# Patient Record
Sex: Female | Born: 1950 | Race: White | Hispanic: No | Smoking: Current every day smoker
Health system: Southern US, Community
[De-identification: ages and names within clinical notes are randomized; demographics above are authoritative.]

## PROBLEM LIST (undated history)

## (undated) DIAGNOSIS — J439 Emphysema, unspecified: Secondary | ICD-10-CM

## (undated) DIAGNOSIS — Z8619 Personal history of other infectious and parasitic diseases: Secondary | ICD-10-CM

## (undated) DIAGNOSIS — G709 Myoneural disorder, unspecified: Secondary | ICD-10-CM

## (undated) DIAGNOSIS — R0602 Shortness of breath: Secondary | ICD-10-CM

## (undated) DIAGNOSIS — E785 Hyperlipidemia, unspecified: Secondary | ICD-10-CM

## (undated) DIAGNOSIS — E7800 Pure hypercholesterolemia, unspecified: Secondary | ICD-10-CM

## (undated) DIAGNOSIS — C801 Malignant (primary) neoplasm, unspecified: Secondary | ICD-10-CM

## (undated) DIAGNOSIS — T783XXA Angioneurotic edema, initial encounter: Secondary | ICD-10-CM

## (undated) DIAGNOSIS — J449 Chronic obstructive pulmonary disease, unspecified: Secondary | ICD-10-CM

## (undated) DIAGNOSIS — F419 Anxiety disorder, unspecified: Secondary | ICD-10-CM

## (undated) HISTORY — PX: CYST REMOVAL HAND: SHX6279

## (undated) HISTORY — DX: Hyperlipidemia, unspecified: E78.5

## (undated) HISTORY — PX: OTHER SURGICAL HISTORY: SHX169

## (undated) HISTORY — PX: BREAST SURGERY: SHX581

## (undated) HISTORY — DX: Pure hypercholesterolemia, unspecified: E78.00

## (undated) HISTORY — DX: Angioneurotic edema, initial encounter: T78.3XXA

---

## 1972-08-01 HISTORY — PX: TUBAL LIGATION: SHX77

## 1988-08-01 HISTORY — PX: ABDOMINAL HYSTERECTOMY: SHX81

## 1992-08-01 HISTORY — PX: FRACTURE SURGERY: SHX138

## 1998-08-01 HISTORY — PX: CHOLECYSTECTOMY: SHX55

## 2005-08-01 HISTORY — PX: HERNIA REPAIR: SHX51

## 2006-01-11 ENCOUNTER — Ambulatory Visit: Admission: RE | Admit: 2006-01-11 | Discharge: 2006-04-11 | Payer: Self-pay | Admitting: Radiation Oncology

## 2006-08-23 ENCOUNTER — Ambulatory Visit (HOSPITAL_COMMUNITY): Admission: RE | Admit: 2006-08-23 | Discharge: 2006-08-23 | Payer: Self-pay | Admitting: Internal Medicine

## 2006-11-29 ENCOUNTER — Ambulatory Visit (HOSPITAL_COMMUNITY): Admission: RE | Admit: 2006-11-29 | Discharge: 2006-11-29 | Payer: Self-pay | Admitting: Family Medicine

## 2010-08-22 ENCOUNTER — Encounter (HOSPITAL_COMMUNITY): Payer: Self-pay | Admitting: Internal Medicine

## 2010-09-22 ENCOUNTER — Other Ambulatory Visit: Payer: Self-pay | Admitting: Orthopedic Surgery

## 2010-09-22 ENCOUNTER — Ambulatory Visit
Admission: RE | Admit: 2010-09-22 | Discharge: 2010-09-22 | Disposition: A | Discharge status: Home / Self Care | Payer: PRIVATE HEALTH INSURANCE | Source: Ambulatory Visit | Attending: Orthopedic Surgery | Admitting: Orthopedic Surgery

## 2010-09-22 ENCOUNTER — Encounter (HOSPITAL_BASED_OUTPATIENT_CLINIC_OR_DEPARTMENT_OTHER)
Admission: RE | Admit: 2010-09-22 | Discharge: 2010-09-22 | Disposition: A | Discharge status: Home / Self Care | Payer: PRIVATE HEALTH INSURANCE | Source: Ambulatory Visit | Attending: Orthopedic Surgery | Admitting: Orthopedic Surgery

## 2010-09-22 DIAGNOSIS — Z01811 Encounter for preprocedural respiratory examination: Secondary | ICD-10-CM

## 2010-09-22 DIAGNOSIS — Z01812 Encounter for preprocedural laboratory examination: Secondary | ICD-10-CM | POA: Insufficient documentation

## 2010-09-22 LAB — BASIC METABOLIC PANEL
BUN: 10 mg/dL (ref 6–23)
Calcium: 9.1 mg/dL (ref 8.4–10.5)
Creatinine, Ser: 0.57 mg/dL (ref 0.4–1.2)
Glucose, Bld: 105 mg/dL — ABNORMAL HIGH (ref 70–99)
Sodium: 139 mEq/L (ref 135–145)

## 2010-09-27 ENCOUNTER — Ambulatory Visit (HOSPITAL_BASED_OUTPATIENT_CLINIC_OR_DEPARTMENT_OTHER)
Admission: RE | Admit: 2010-09-27 | Discharge: 2010-09-27 | Disposition: A | Discharge status: Home / Self Care | Payer: PRIVATE HEALTH INSURANCE | Source: Ambulatory Visit | Attending: Orthopedic Surgery | Admitting: Orthopedic Surgery

## 2010-09-27 DIAGNOSIS — M67919 Unspecified disorder of synovium and tendon, unspecified shoulder: Secondary | ICD-10-CM | POA: Insufficient documentation

## 2010-09-27 DIAGNOSIS — M24119 Other articular cartilage disorders, unspecified shoulder: Secondary | ICD-10-CM | POA: Insufficient documentation

## 2010-09-27 DIAGNOSIS — M19019 Primary osteoarthritis, unspecified shoulder: Secondary | ICD-10-CM | POA: Insufficient documentation

## 2010-09-27 DIAGNOSIS — E119 Type 2 diabetes mellitus without complications: Secondary | ICD-10-CM | POA: Insufficient documentation

## 2010-09-27 DIAGNOSIS — I10 Essential (primary) hypertension: Secondary | ICD-10-CM | POA: Insufficient documentation

## 2010-09-27 DIAGNOSIS — J449 Chronic obstructive pulmonary disease, unspecified: Secondary | ICD-10-CM | POA: Insufficient documentation

## 2010-09-27 DIAGNOSIS — Z01812 Encounter for preprocedural laboratory examination: Secondary | ICD-10-CM | POA: Insufficient documentation

## 2010-09-27 DIAGNOSIS — F17200 Nicotine dependence, unspecified, uncomplicated: Secondary | ICD-10-CM | POA: Insufficient documentation

## 2010-09-27 DIAGNOSIS — M25819 Other specified joint disorders, unspecified shoulder: Secondary | ICD-10-CM | POA: Insufficient documentation

## 2010-09-27 DIAGNOSIS — J4489 Other specified chronic obstructive pulmonary disease: Secondary | ICD-10-CM | POA: Insufficient documentation

## 2010-09-27 DIAGNOSIS — M7500 Adhesive capsulitis of unspecified shoulder: Secondary | ICD-10-CM | POA: Insufficient documentation

## 2010-09-27 DIAGNOSIS — M719 Bursopathy, unspecified: Secondary | ICD-10-CM | POA: Insufficient documentation

## 2010-09-27 LAB — POCT HEMOGLOBIN-HEMACUE: Hemoglobin: 16 g/dL — ABNORMAL HIGH (ref 12.0–15.0)

## 2010-10-07 NOTE — Op Note (Signed)
NAME:  [name], [name]                ACCOUNT NO.:  [id number]  MEDICAL RECORD NO.:  [id number]          PATIENT TYPE:  LOCATION:                                 FACILITY:  PHYSICIAN:  Elana Alm. Thurston Hole, M.D. DATE OF BIRTH:  05/24/1955  DATE OF PROCEDURE:  09/27/2010 DATE OF DISCHARGE:                              OPERATIVE REPORT   PREOPERATIVE DIAGNOSES: 1. Right shoulder rotator cuff tear. 2. Right shoulder adhesive capsulitis. 3. Right shoulder partial labrum tear. 4. Right shoulder impingement. 5. Right shoulder acromioclavicular joint degenerative joint disease     and spurring.  POSTOPERATIVE DIAGNOSES: 1. Right shoulder rotator cuff tear. 2. Right shoulder adhesive capsulitis. 3. Right shoulder partial labrum tear. 4. Right shoulder impingement. 5. Right shoulder acromioclavicular joint degenerative joint disease     and spurring.  PROCEDURES: 1. Right shoulder EUA followed by arthroscopically assisted rotator cuff repair using Arthrex SwiveLock anchor x1. 2. Right shoulder manipulation with arthroscopic lysis of adhesions     with partial labrum tear debridement. 3. Right shoulder subacromial decompression. 4. Right shoulder distal clavicle excision.  SURGEON:  Elana Alm. Thurston Hole, MD  ANESTHESIA:  General.  OPERATIVE TIME:  One hour and 45 minutes.  COMPLICATIONS:  None.  INDICATIONS FOR PROCEDURE:  [name] is a 60 year old woman who has had over 4-5 months of increasing right shoulder pain with exam and MRI documenting rotator cuff tear adhesive capsulitis, partial labrum tear with impingement and AC joint arthropathy.  She has failed conservative care and is now to undergo arthroscopy and repair.  DESCRIPTION:  [name] was brought to the operating room on September 27, 2010, after an interscalene block was placed in holding room by Anesthesia.  She was placed on operative table in supine position. After being placed under general anesthesia, her right  shoulder was examined.  Initial range of motion showed forward flexion 150, abduction 150, internal and external rotation of 60 degrees.  A gentle manipulation was carried out breaking up adhesions and improving range of motion and near full range of motion.  Shoulder remained stable on ligamentous exam.  She was then placed in beach-chair position and her shoulder and arm was prepped using sterile DuraPrep and draped using sterile technique.  She received Ancef 1 g IV preoperatively for prophylaxis.  Time-out procedure was called and the correct right shoulder identified.  Initially, through a posterior arthroscopic portal, the arthroscope with a pump attached was placed into an anterior portal and arthroscopic probe was placed.  On initial inspection, the articular cartilage in the glenohumeral joint was found to be intact. She had partial tear in the anterior superior and posterior labrum, 25% which was debrided.  The anterior-inferior labrum and anterior-inferior glenohumeral ligament complex was intact.  Biceps tendon anchor and biceps tendon was intact.  There was significant erythematous synovitis which was partially debrided and cauterized.  Rotator cuff was inspected.  She had a complete tear of the supraspinatus and the anterior 50% of the infraspinatus, this was partially debrided arthroscopically.  The rest of the rotator cuff was intact.  Subacromial space was entered and a lateral arthroscopic portal  was made. Moderately thickened bursitis was resected.  Impingement was noted and a subacromial decompression was carried out removing 6-8 mm of the undersurface of the anterior, anterolateral, anteromedial acromion, and CA ligament release carried out as well.  The [facility] joint showed significant spurring and degenerative changes and the distal 8-10 mm of clavicle was resected with a 6-mm bur.  After this was done, initial attempts were made for a primary rotator cuff repair with  one Arthrex 5.5-mm suture anchor in the greater tuberosity, however, her bone was very osteoporotic and after attempts at this 5.5-mm anchor and this was unsuccessful that would not hold in the bone and then a 6.5-mm anchor was also placed and this also would not hold in her bone as well. Because of this, it was felt that a fiber tape plus a suture cinch could be used with a SwiveLock anchor in the very lateral tip of her greater tuberosity which was the hardest bone available in her proximal humerus. A mattress suture technique was used to place a mattress suture of fiber tape and then a suture cinch placed as well in the rotator cuff tear and then these sets of sutures were placed in the SwiveLock and placed into the superior lateral tip of the greater tuberosity with satisfactory fixation.  Shoulder could be brought through a satisfactory range of motion with no impingement on the repair after this was done.  At this point, it was felt that all pathology have been satisfactorily addressed.  The instruments were removed.  Portal was closed with 3-0 nylon suture.  Sterile dressings and abduction sling applied and the patient awakened and taken to recovery in stable condition.  FOLLOWUP CARE:  [name] will be followed as an outpatient on Percocet and Robaxin with early physical therapy.  She will be back in our office in a week for sutures out and followup.     Jyl Chico A. Thurston Hole, M.D.     RAW/MEDQ  D:  09/27/2010  T:  09/28/2010  Job:  [id number]  Electronically Signed by Salvatore Marvel M.D. on 10/06/2010 02:16:16 PM

## 2011-06-02 DIAGNOSIS — Z8619 Personal history of other infectious and parasitic diseases: Secondary | ICD-10-CM

## 2011-06-02 HISTORY — DX: Personal history of other infectious and parasitic diseases: Z86.19

## 2011-07-19 ENCOUNTER — Other Ambulatory Visit: Payer: Self-pay | Admitting: Orthopedic Surgery

## 2011-08-08 ENCOUNTER — Encounter (HOSPITAL_BASED_OUTPATIENT_CLINIC_OR_DEPARTMENT_OTHER): Payer: Self-pay | Admitting: *Deleted

## 2011-08-08 NOTE — Patient Instructions (Signed)
Patient to come in prior to DOS for EKG and BMET.  Pt. To take prilosec and celexa morning of surgery with sip of water.

## 2011-08-09 ENCOUNTER — Other Ambulatory Visit: Payer: Self-pay

## 2011-08-09 ENCOUNTER — Encounter (HOSPITAL_BASED_OUTPATIENT_CLINIC_OR_DEPARTMENT_OTHER)
Admission: RE | Admit: 2011-08-09 | Discharge: 2011-08-09 | Disposition: A | Discharge status: Home / Self Care | Payer: Medicaid Other | Source: Ambulatory Visit | Attending: Orthopedic Surgery | Admitting: Orthopedic Surgery

## 2011-08-09 LAB — BASIC METABOLIC PANEL
BUN: 9 mg/dL (ref 6–23)
GFR calc Af Amer: >90 mL/min (ref >90)
GFR calc non Af Amer: >90 mL/min (ref >90)

## 2011-08-11 ENCOUNTER — Ambulatory Visit (HOSPITAL_BASED_OUTPATIENT_CLINIC_OR_DEPARTMENT_OTHER)
Admission: RE | Admit: 2011-08-11 | Discharge: 2011-08-11 | Disposition: A | Discharge status: Home / Self Care | Payer: Medicaid Other | Source: Ambulatory Visit | Attending: Orthopedic Surgery | Admitting: Orthopedic Surgery

## 2011-08-11 ENCOUNTER — Encounter (HOSPITAL_BASED_OUTPATIENT_CLINIC_OR_DEPARTMENT_OTHER)
Admission: RE | Disposition: A | Discharge status: Home / Self Care | Payer: Self-pay | Source: Ambulatory Visit | Attending: Orthopedic Surgery

## 2011-08-11 ENCOUNTER — Other Ambulatory Visit: Payer: Self-pay | Admitting: Orthopedic Surgery

## 2011-08-11 ENCOUNTER — Encounter (HOSPITAL_BASED_OUTPATIENT_CLINIC_OR_DEPARTMENT_OTHER): Payer: Self-pay | Admitting: Anesthesiology

## 2011-08-11 ENCOUNTER — Encounter (HOSPITAL_BASED_OUTPATIENT_CLINIC_OR_DEPARTMENT_OTHER): Payer: Self-pay

## 2011-08-11 ENCOUNTER — Encounter (HOSPITAL_BASED_OUTPATIENT_CLINIC_OR_DEPARTMENT_OTHER): Payer: Self-pay | Admitting: Orthopedic Surgery

## 2011-08-11 ENCOUNTER — Ambulatory Visit (HOSPITAL_BASED_OUTPATIENT_CLINIC_OR_DEPARTMENT_OTHER): Payer: Medicaid Other | Admitting: Anesthesiology

## 2011-08-11 DIAGNOSIS — J449 Chronic obstructive pulmonary disease, unspecified: Secondary | ICD-10-CM | POA: Insufficient documentation

## 2011-08-11 DIAGNOSIS — R0602 Shortness of breath: Secondary | ICD-10-CM | POA: Insufficient documentation

## 2011-08-11 DIAGNOSIS — E119 Type 2 diabetes mellitus without complications: Secondary | ICD-10-CM | POA: Insufficient documentation

## 2011-08-11 DIAGNOSIS — M65849 Other synovitis and tenosynovitis, unspecified hand: Secondary | ICD-10-CM | POA: Insufficient documentation

## 2011-08-11 DIAGNOSIS — M6740 Ganglion, unspecified site: Secondary | ICD-10-CM | POA: Insufficient documentation

## 2011-08-11 DIAGNOSIS — J4489 Other specified chronic obstructive pulmonary disease: Secondary | ICD-10-CM | POA: Insufficient documentation

## 2011-08-11 DIAGNOSIS — M6530 Trigger finger, unspecified finger: Secondary | ICD-10-CM | POA: Insufficient documentation

## 2011-08-11 DIAGNOSIS — M65839 Other synovitis and tenosynovitis, unspecified forearm: Secondary | ICD-10-CM | POA: Insufficient documentation

## 2011-08-11 DIAGNOSIS — F17200 Nicotine dependence, unspecified, uncomplicated: Secondary | ICD-10-CM | POA: Insufficient documentation

## 2011-08-11 DIAGNOSIS — Z01810 Encounter for preprocedural cardiovascular examination: Secondary | ICD-10-CM | POA: Insufficient documentation

## 2011-08-11 HISTORY — PX: MASS EXCISION: SHX2000

## 2011-08-11 HISTORY — DX: Chronic obstructive pulmonary disease, unspecified: J44.9

## 2011-08-11 HISTORY — DX: Myoneural disorder, unspecified: G70.9

## 2011-08-11 HISTORY — DX: Shortness of breath: R06.02

## 2011-08-11 HISTORY — DX: Malignant (primary) neoplasm, unspecified: C80.1

## 2011-08-11 HISTORY — DX: Anxiety disorder, unspecified: F41.9

## 2011-08-11 HISTORY — DX: Personal history of other infectious and parasitic diseases: Z86.19

## 2011-08-11 LAB — POCT HEMOGLOBIN-HEMACUE: Hemoglobin: 13.6 g/dL (ref 12.0–15.0)

## 2011-08-11 LAB — GLUCOSE, CAPILLARY: Glucose-Capillary: 141 mg/dL — ABNORMAL HIGH (ref 70–99)

## 2011-08-11 SURGERY — EXCISION MASS
Anesthesia: General | Site: Wrist | Laterality: Right | Wound class: Clean

## 2011-08-11 MED ORDER — DEXAMETHASONE SODIUM PHOSPHATE 4 MG/ML IJ SOLN
INTRAMUSCULAR | Status: DC | PRN
  Administered 2011-08-11: 10 mg via INTRAVENOUS

## 2011-08-11 MED ORDER — ONDANSETRON HCL 4 MG/2ML IJ SOLN
INTRAMUSCULAR | Status: DC | PRN
  Administered 2011-08-11: 4 mg via INTRAVENOUS

## 2011-08-11 MED ORDER — MIDAZOLAM HCL 5 MG/5ML IJ SOLN
INTRAMUSCULAR | Status: DC | PRN
  Administered 2011-08-11: 2 mg via INTRAVENOUS

## 2011-08-11 MED ORDER — METOCLOPRAMIDE HCL 5 MG/ML IJ SOLN: 10.0000 mg | Freq: Once | INTRAMUSCULAR | Status: DC | PRN

## 2011-08-11 MED ORDER — FENTANYL CITRATE 0.05 MG/ML IJ SOLN
INTRAMUSCULAR | Status: DC | PRN
  Administered 2011-08-11: 50 ug via INTRAVENOUS
  Administered 2011-08-11: 25 ug via INTRAVENOUS

## 2011-08-11 MED ORDER — PROPOFOL 10 MG/ML IV EMUL
INTRAVENOUS | Status: DC | PRN
  Administered 2011-08-11: 200 mg via INTRAVENOUS

## 2011-08-11 MED ORDER — LACTATED RINGERS IV SOLN
INTRAVENOUS | Status: DC
  Administered 2011-08-11: 08:00:00 via INTRAVENOUS

## 2011-08-11 MED ORDER — MORPHINE SULFATE 2 MG/ML IJ SOLN: 0.0500 mg/kg | INTRAMUSCULAR | Status: DC | PRN

## 2011-08-11 MED ORDER — DROPERIDOL 2.5 MG/ML IJ SOLN
INTRAMUSCULAR | Status: DC | PRN
  Administered 2011-08-11: 0.625 mg via INTRAVENOUS

## 2011-08-11 MED ORDER — CHLORHEXIDINE GLUCONATE 4 % EX LIQD: 60.0000 mL | Freq: Once | CUTANEOUS | Status: DC

## 2011-08-11 MED ORDER — MIDAZOLAM HCL 2 MG/2ML IJ SOLN: 0.5000 mg | INTRAMUSCULAR | Status: DC | PRN

## 2011-08-11 MED ORDER — OXYCODONE-ACETAMINOPHEN 5-325 MG PO TABS: 1.0000 | ORAL_TABLET | ORAL | Status: AC | PRN

## 2011-08-11 MED ORDER — FENTANYL CITRATE 0.05 MG/ML IJ SOLN: 25.0000 ug | INTRAMUSCULAR | Status: DC | PRN

## 2011-08-11 MED ORDER — LIDOCAINE HCL (PF) 2 % IJ SOLN
INTRAMUSCULAR | Status: DC | PRN
  Administered 2011-08-11: 4.5 mL

## 2011-08-11 MED ORDER — FENTANYL CITRATE 0.05 MG/ML IJ SOLN: 50.0000 ug | INTRAMUSCULAR | Status: DC | PRN

## 2011-08-11 SURGICAL SUPPLY — 52 items
BANDAGE ADHESIVE 1X3 (GAUZE/BANDAGES/DRESSINGS) IMPLANT
BANDAGE ELASTIC 3 VELCRO ST LF (GAUZE/BANDAGES/DRESSINGS) ×1 IMPLANT
BLADE MINI RND TIP GREEN BEAV (BLADE) IMPLANT
BLADE SURG 15 STRL LF DISP TIS (BLADE) ×1 IMPLANT
BLADE SURG 15 STRL SS (BLADE) ×2
BNDG CMPR 9X4 STRL LF SNTH (GAUZE/BANDAGES/DRESSINGS) ×1
BNDG CMPR MD 5X2 ELC HKLP STRL (GAUZE/BANDAGES/DRESSINGS)
BNDG COHESIVE 1X5 TAN STRL LF (GAUZE/BANDAGES/DRESSINGS) ×2 IMPLANT
BNDG ELASTIC 2 VLCR STRL LF (GAUZE/BANDAGES/DRESSINGS) IMPLANT
BNDG ESMARK 4X9 LF (GAUZE/BANDAGES/DRESSINGS) ×1 IMPLANT
BRUSH SCRUB EZ PLAIN DRY (MISCELLANEOUS) ×2 IMPLANT
CLOTH BEACON ORANGE TIMEOUT ST (SAFETY) ×2 IMPLANT
CORDS BIPOLAR (ELECTRODE) IMPLANT
COVER MAYO STAND STRL (DRAPES) ×2 IMPLANT
COVER TABLE BACK 60X90 (DRAPES) ×2 IMPLANT
CUFF TOURNIQUET SINGLE 18IN (TOURNIQUET CUFF) ×1 IMPLANT
DECANTER SPIKE VIAL GLASS SM (MISCELLANEOUS) IMPLANT
DRAIN PENROSE 1/2X12 LTX STRL (WOUND CARE) IMPLANT
DRAIN PENROSE 1/4X12 LTX STRL (WOUND CARE) IMPLANT
DRAPE EXTREMITY T 121X128X90 (DRAPE) ×2 IMPLANT
DRAPE SURG 17X23 STRL (DRAPES) ×2 IMPLANT
GAUZE XEROFORM 1X8 LF (GAUZE/BANDAGES/DRESSINGS) IMPLANT
GLOVE BIOGEL PI IND STRL 8 (GLOVE) ×1 IMPLANT
GLOVE BIOGEL PI INDICATOR 8 (GLOVE) ×1
GLOVE ORTHO TXT STRL SZ7.5 (GLOVE) ×2 IMPLANT
GOWN PREVENTION PLUS XLARGE (GOWN DISPOSABLE) ×2 IMPLANT
GOWN STRL REIN XL XLG (GOWN DISPOSABLE) ×4 IMPLANT
NDL BLUNT 17GA (NEEDLE) ×1 IMPLANT
NEEDLE 27GAX1X1/2 (NEEDLE) ×1 IMPLANT
NEEDLE BLUNT 17GA (NEEDLE) ×2 IMPLANT
PACK BASIN DAY SURGERY FS (CUSTOM PROCEDURE TRAY) ×2 IMPLANT
PAD CAST 3X4 CTTN HI CHSV (CAST SUPPLIES) IMPLANT
PADDING CAST COTTON 3X4 STRL (CAST SUPPLIES) ×2
PADDING UNDERCAST 2  STERILE (CAST SUPPLIES) IMPLANT
SPLINT PLASTER CAST XFAST 3X15 (CAST SUPPLIES) IMPLANT
SPLINT PLASTER XTRA FASTSET 3X (CAST SUPPLIES) ×5
SPONGE GAUZE 4X4 12PLY (GAUZE/BANDAGES/DRESSINGS) ×1 IMPLANT
STOCKINETTE 4X48 STRL (DRAPES) ×2 IMPLANT
STRIP CLOSURE SKIN 1/2X4 (GAUZE/BANDAGES/DRESSINGS) IMPLANT
SUT ETHILON 5 0 P 3 18 (SUTURE) ×1
SUT MERSILENE 4 0 P 3 (SUTURE) IMPLANT
SUT NYLON ETHILON 5-0 P-3 1X18 (SUTURE) ×1 IMPLANT
SUT PROLENE 3 0 PS 2 (SUTURE) IMPLANT
SUT VIC AB 4-0 P-3 18XBRD (SUTURE) IMPLANT
SUT VIC AB 4-0 P3 18 (SUTURE) ×2
SYR 20CC LL (SYRINGE) ×2 IMPLANT
SYR 3ML 23GX1 SAFETY (SYRINGE) IMPLANT
SYR CONTROL 10ML LL (SYRINGE) ×1 IMPLANT
TOWEL OR 17X24 6PK STRL BLUE (TOWEL DISPOSABLE) ×2 IMPLANT
TRAY DSU PREP LF (CUSTOM PROCEDURE TRAY) ×2 IMPLANT
UNDERPAD 30X30 INCONTINENT (UNDERPADS AND DIAPERS) ×2 IMPLANT
WATER STERILE IRR 1000ML POUR (IV SOLUTION) IMPLANT

## 2011-08-11 NOTE — Op Note (Signed)
Op note dictated 08/11/11 161096

## 2011-08-11 NOTE — Op Note (Signed)
NAME:  [name]                  ACCOUNT NO.:  MEDICAL RECORD NO.:  [id number]  LOCATION:                                 FACILITY:  PHYSICIAN:  Katy Fitch. Abrielle Finck, M.D.      DATE OF BIRTH:  DATE OF PROCEDURE:  08/11/2011 DATE OF DISCHARGE:                              OPERATIVE REPORT   PREOPERATIVE DIAGNOSIS:  Painful mass dorsal aspect of right wrist consistent with enlargement of extensor digitorum longus to long finger with triggering beneath the extensor retinaculum at the 4th dorsal compartment.  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  OPERATION: 1. Step-cut enlargement of 4th dorsal compartment for release of     extensor stenosing tenosynovitis. 2. Partial resection and synovial biopsy of right long finger extensor     digitorum longus tendon reducing bulk of tendon nodule and synovial     infiltration of tendon.  OPERATING SURGEON:  Katy Fitch. Matisyn Cabeza, M.D.  ASSIST:  Marveen Reeks Dasnoit, P.A.  ANESTHESIA:  General by LMA.  SUPERVISING ANESTHESIOLOGIST:  Janetta Hora. Gelene Mink, M.D.  INDICATIONS:  [name] is a 61 year old woman referred through the courtesy of Dr. Selinda Flavin of [facility].  [name] had a history of painful swelling on the dorsal aspect of her right wrist.  She was noted to have triggering of her extensor tendons beneath the extensor retinaculum and had a very large mass in the long finger extensor.  We advised excisional biopsy and exploration looking for signs of extensor stenosing tenosynovitis.  After informed consent, she was brought to the operating room at this time.  Preoperatively, we worked her up for possible inflammatory arthritis.  I did not find any evidence of this predicament.  PROCEDURE:  [name] was brought to room 6 of the [facility] and placed in  supine position on the operating table.  Following the induction of general anesthesia by LMA technique, the right arm was prepped with Betadine soap and solution, sterilely draped. A pneumatic tourniquet was applied to the proximal right brachium.  Following exsanguination of the right arm with Esmarch bandage, arterial tourniquet was inflated to 250 mmHg.  Following routine surgical time-out, procedure commenced with a longitudinal incision directly over the mass, extending proximally to the extensor retinaculum.  Subcutaneous tissues were carefully divided taking care to electrocauterize transverse vessels.  Care was taken to spare the dorsal, radial, and ulnar sensory branches.  The extensor to the long finger was noted to be swollen to more than 200% of its normal width. It had orange red infiltrating tenosynovium and what appeared to be either a myxoid cyst or synovial mass within the tendon.  The tendon was noted to be frayed with about a 30% partial rupture where it was abrading at the distal margin of the transverse carpal ligament.  The extensor tendons were debrided of all frayed material followed by incision of the long finger extensor.  All of the synovial tissue that was infiltrating the tendon was removed with a micro rongeur, sent for pathologic evaluation.  The margins of  the synovial resection were then electrocauterized under saline with bipolar forceps.  Hemostasis was achieved.  We then repaired the extensor retinaculum over distance of 1 cm with a step-cut enlarging the distal margin of the retinaculum.  We then examined the wrist in full extension and finger extension assuring that the extensor did not abrade at the site of the step-cut in large retinaculum.  The wound was then inspected for bleeding points followed by repair of the skin with interrupted subcutaneous suture of 4-0 Vicryl and intradermal 2-0 Prolene.  Compressive dressing was applied with a  volar plaster splint, stabilizing the wrist.  There were no apparent complications.  For aftercare, [name] was provided a prescription for Percocet 5 mg 1 p.o. q.4-6 hours p.r.n. pain, 20 tablets without refill.  We will see her back for followup in 7 days for a recheck, dressing change, and advancement to an exercise program.     Katy Fitch. Trevante Tennell, M.D.     RVS/MEDQ  D:  08/11/2011  T:  08/11/2011  Job:  [id number]  cc:   Selinda Flavin, MD

## 2011-08-11 NOTE — Transfer of Care (Signed)
Immediate Anesthesia Transfer of Care Note  Patient: [name]  Procedure(s) Performed:  EXCISION MASS - Synovectomy of extnsor tendon right long finger  Patient Location: PACU  Anesthesia Type: General  Level of Consciousness: sedated  Airway & Oxygen Therapy: Patient Spontanous Breathing and Patient connected to face mask oxygen  Post-op Assessment: Report given to PACU RN and Post -op Vital signs reviewed and stable  Post vital signs: Reviewed and stable  Complications: No apparent anesthesia complications

## 2011-08-11 NOTE — H&P (Signed)
[name] is an 61 y.o. female.   Chief Complaint: Complaining of painful mass dorsum right wrist HPI: Patient is a 61 year old right-hand-dominant female who presented to our office recently for evaluation and treatment of painful mass on the dorsum of her right wrist. She recalls no history of injury to the wrist. He states that this is been present for approximately one year and had has become increasingly more painful.  Past Medical History  Diagnosis Date  . Diabetes mellitus   . COPD (chronic obstructive pulmonary disease)   . Shortness of breath   . Cancer   . Anxiety   . Neuromuscular disorder   . History of shingles 06/2011    Past Surgical History  Procedure Date  . Tubal ligation 1974  . Abdominal hysterectomy 1990  . Fracture surgery 1994  . Breast surgery 1998 and 2000  . Hernia repair 2007  . Cholecystectomy 2000    History reviewed. No pertinent family history. Social History:  reports that she has been smoking Cigarettes.  She has a 48 pack-year smoking history. She does not have any smokeless tobacco history on file. She reports that she does not drink alcohol or use illicit drugs.  Allergies:  Allergies  Allergen Reactions  . Vicodin (Hydrocodone-Acetaminophen) Itching    Medications Prior to Admission  Medication Dose Route Frequency Provider Last Rate Last Dose  . chlorhexidine (HIBICLENS) 4 % liquid 4 application  60 mL Topical Once       . fentaNYL (SUBLIMAZE) injection 50-100 mcg  50-100 mcg Intravenous PRN Constance Goltz, MD      . lactated ringers infusion   Intravenous Continuous Constance Goltz, MD      . midazolam (VERSED) injection 0.5-2 mg  0.5-2 mg Intravenous PRN Constance Goltz, MD       Medications Prior to Admission  Medication Sig Dispense Refill  . aspirin 81 MG tablet Take 160 mg by mouth daily.        . citalopram (CELEXA) 10 MG tablet Take 10 mg by mouth daily.        . fish oil-omega-3 fatty acids  1000 MG capsule Take 2 g by mouth daily.        Marland Kitchen glipiZIDE (GLUCOTROL) 10 MG tablet Take 10 mg by mouth 2 (two) times daily before a meal.        . omeprazole (PRILOSEC) 10 MG capsule Take 10 mg by mouth daily.        . potassium chloride (K-DUR,KLOR-CON) 10 MEQ tablet Take 10 mEq by mouth 2 (two) times daily.        . vitamin B-12 (CYANOCOBALAMIN) 50 MCG tablet Take 50 mcg by mouth daily.        Marland Kitchen zinc sulfate 220 MG capsule Take 220 mg by mouth daily.          Results for orders placed during the hospital encounter of 08/11/11 (from the past 48 hour(s))  BASIC METABOLIC PANEL     Status: Abnormal   Collection Time   08/09/11 12:22 PM      Component Value Range Comment   Sodium 139  135 - 145 (mEq/L)    Potassium 4.2  3.5 - 5.1 (mEq/L)    Chloride 103  96 - 112 (mEq/L)    CO2 30  19 - 32 (mEq/L)    Glucose, Bld 121 (*) 70 - 99 (mg/dL)    BUN 9  6 - 23 (mg/dL)    Creatinine, Ser 1.61  0.50 - 1.10 (mg/dL)    Calcium 9.0  8.4 - 10.5 (mg/dL)    GFR calc non Af Amer >90  >90 (mL/min)    GFR calc Af Amer >90  >90 (mL/min)   GLUCOSE, CAPILLARY     Status: Abnormal   Collection Time   08/11/11  7:07 AM      Component Value Range Comment   Glucose-Capillary 141 (*) 70 - 99 (mg/dL)     No results found.   Pertinent items are noted in HPI.  Blood pressure 120/63, pulse 71, temperature 97.8 F (36.6 C), temperature source Oral, resp. rate 20, height 5\' 3"  (1.6 m), weight 77.111 kg (170 lb), SpO2 96.00%.  General appearance: alert Head: Normocephalic, without obvious abnormality Neck: supple, symmetrical, trachea midline Resp: clear to auscultation bilaterally Cardio: regular rate and rhythm, S1, S2 normal, no murmur, click, rub or gallop GI: normal findings: bowel sounds normal Extremities:. Inspection of her hand reveals a 1.5 cm mass at her long finger extensor. This is an extensor ganglion or solid soft tissue mass that causes entrapment beneath the extensor retinaculum with full  finger extension and wrist extension. She has full ROM of her fingers in flexion/extension and full ROM of her thumb. She has full wrist motion.   Plan films of her hand and wrist demonstrates normal bony anatomy. 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   Plan: Patient to be taken to the operating room to undergo excisional biopsy of myxoid cyst dorsum right wrist. The procedure risks benefits and postoperative course were discussed with the patient and her husband at length and they were in agreement with this plan.  DASNOIT,[name] 08/11/2011, 7:18 AM    H&P documentation: 08/11/2011  -History and Physical Reviewed  -Patient has been re-examined  -No change in the plan of care  Kaylee Forster, MD

## 2011-08-11 NOTE — Anesthesia Postprocedure Evaluation (Signed)
Anesthesia Post Note  Patient: [name]  Procedure(s) Performed:  EXCISION MASS - Synovectomy of extnsor tendon right long finger  Anesthesia type: General  Patient location: PACU  Post pain: Pain level controlled  Post assessment: Patient's Cardiovascular Status Stable  Last Vitals:  Filed Vitals:   08/11/11 0936  BP:   Pulse: 90  Temp: 36.4 C  Resp: 16    Post vital signs: Reviewed and stable  Level of consciousness: alert  Complications: No apparent anesthesia complications

## 2011-08-11 NOTE — Anesthesia Procedure Notes (Signed)
Procedure Name: LMA Insertion Date/Time: 08/11/2011 8:02 AM Performed by: Jearld Shines Pre-anesthesia Checklist: Patient identified, Emergency Drugs available, Suction available and Patient being monitored Patient Re-evaluated:Patient Re-evaluated prior to inductionOxygen Delivery Method: Circle System Utilized Preoxygenation: Pre-oxygenation with 100% oxygen Intubation Type: IV induction Ventilation: Mask ventilation without difficulty LMA: LMA inserted LMA Size: 4.0 Number of attempts: 1 Airway Equipment and Method: bite block Placement Confirmation: positive ETCO2 Tube secured with: Tape Dental Injury: Teeth and Oropharynx as per pre-operative assessment

## 2011-08-11 NOTE — Anesthesia Preprocedure Evaluation (Addendum)
Anesthesia Evaluation  Patient identified by MRN, date of birth, ID band Patient awake    Reviewed: Allergy & Precautions, H&P , NPO status , Patient's Chart, lab work & pertinent test results, reviewed documented beta blocker date and time   Airway Mallampati: II TM Distance: >3 FB Neck ROM: full    Dental   Pulmonary shortness of breath and with exertion, COPDCurrent Smoker,          Cardiovascular neg cardio ROS     Neuro/Psych  Neuromuscular disease Negative Psych ROS   GI/Hepatic negative GI ROS, Neg liver ROS,   Endo/Other  Diabetes mellitus-, Type 2, Oral Hypoglycemic Agents  Renal/GU negative Renal ROS  Genitourinary negative   Musculoskeletal   Abdominal   Peds  Hematology negative hematology ROS (+)   Anesthesia Other Findings See surgeon's H&P   Reproductive/Obstetrics negative OB ROS                          Anesthesia Physical Anesthesia Plan  ASA: III  Anesthesia Plan: General   Post-op Pain Management:    Induction: Intravenous  Airway Management Planned: LMA  Additional Equipment:   Intra-op Plan:   Post-operative Plan: Extubation in OR  Informed Consent: I have reviewed the patients History and Physical, chart, labs and discussed the procedure including the risks, benefits and alternatives for the proposed anesthesia with the patient or authorized representative who has indicated his/her understanding and acceptance.     Plan Discussed with: CRNA and Surgeon  Anesthesia Plan Comments:        Anesthesia Quick Evaluation

## 2011-08-11 NOTE — Brief Op Note (Signed)
08/11/2011  8:26 AM  PATIENT:  [name]  61 y.o. female  PRE-OPERATIVE DIAGNOSIS:  mass right wrist right long finger extensor  POST-OPERATIVE DIAGNOSIS:  Infiltrating synovitis right long extensor with stenosing tenosynovitis of extensors at 4th dorsal compartment  PROCEDURE:  Procedure(s): EXCISIONAL BIOPSY, SYNOVECTOMY OF 4TH DORSAL COMPARTMENT AND STEP CUT ENLARGEMENT OF EXTENSOR RETINACULUM  SURGEON:  Surgeon(s): Wyn Forster., MD  PHYSICIAN ASSISTANT:   ASSISTANTS: Mallory Shirk.A-C    ANESTHESIA:   general  EBL:  Total I/O In: 200 [I.V.:200] Out: -   BLOOD ADMINISTERED:none  DRAINS: none   LOCAL MEDICATIONS USED:  LIDOCAINE 3 CC 2 %  SPECIMEN:  Excision  DISPOSITION OF SPECIMEN:  PATHOLOGY  COUNTS:  YES  TOURNIQUET:  * Missing tourniquet times found for documented tourniquets in log:  [id number] *  DICTATION: .Other Dictation: Dictation Number [id number]  PLAN OF CARE: Discharge to home after PACU  PATIENT DISPOSITION:  PACU - hemodynamically stable.

## 2011-08-12 ENCOUNTER — Encounter (HOSPITAL_BASED_OUTPATIENT_CLINIC_OR_DEPARTMENT_OTHER): Payer: Self-pay | Admitting: Orthopedic Surgery

## 2011-08-17 ENCOUNTER — Encounter (HOSPITAL_BASED_OUTPATIENT_CLINIC_OR_DEPARTMENT_OTHER): Payer: Self-pay

## 2013-08-28 ENCOUNTER — Encounter (HOSPITAL_COMMUNITY): Payer: Self-pay | Admitting: Pharmacy Technician

## 2013-09-04 NOTE — Patient Instructions (Signed)
Your procedure is scheduled on: 09/10/2013  Report to [facility] at  26  AM.  Call this number if you have problems the morning of surgery: [phone]   Do not eat food or drink liquids :After Midnight.      Take these medicines the morning of surgery with A SIP OF WATER: celexa, prilosec   Do not wear jewelry, make-up or nail polish.  Do not wear lotions, powders, or perfumes.   Do not shave 48 hours prior to surgery.  Do not bring valuables to the hospital.  Contacts, dentures or bridgework may not be worn into surgery.  Leave suitcase in the car. After surgery it may be brought to your room.  For patients admitted to the hospital, checkout time is 11:00 AM the day of discharge.   Patients discharged the day of surgery will not be allowed to drive home.  :     Please read over the following fact sheets that you were given: Coughing and Deep Breathing, Surgical Site Infection Prevention, Anesthesia Post-op Instructions and Care and Recovery After Surgery    Cataract A cataract is a clouding of the lens of the eye. When a lens becomes cloudy, vision is reduced based on the degree and nature of the clouding. Many cataracts reduce vision to some degree. Some cataracts make people more near-sighted as they develop. Other cataracts increase glare. Cataracts that are ignored and become worse can sometimes look white. The white color can be seen through the pupil. CAUSES   Aging. However, cataracts may occur at any age, even in newborns.   Certain drugs.   Trauma to the eye.   Certain diseases such as diabetes.   Specific eye diseases such as chronic inflammation inside the eye or a sudden attack of a rare form of glaucoma.   Inherited or acquired medical problems.  SYMPTOMS   Gradual, progressive drop in vision in the affected eye.   Severe, rapid visual loss. This most often happens when trauma is the cause.  DIAGNOSIS  To detect a cataract, an eye doctor examines the lens.  Cataracts are best diagnosed with an exam of the eyes with the pupils enlarged (dilated) by drops.  TREATMENT  For an early cataract, vision may improve by using different eyeglasses or stronger lighting. If that does not help your vision, surgery is the only effective treatment. A cataract needs to be surgically removed when vision loss interferes with your everyday activities, such as driving, reading, or watching TV. A cataract may also have to be removed if it prevents examination or treatment of another eye problem. Surgery removes the cloudy lens and usually replaces it with a substitute lens (intraocular lens, IOL).  At a time when both you and your doctor agree, the cataract will be surgically removed. If you have cataracts in both eyes, only one is usually removed at a time. This allows the operated eye to heal and be out of danger from any possible problems after surgery (such as infection or poor wound healing). In rare cases, a cataract may be doing damage to your eye. In these cases, your caregiver may advise surgical removal right away. The vast majority of people who have cataract surgery have better vision afterward. HOME CARE INSTRUCTIONS  If you are not planning surgery, you may be asked to do the following:  Use different eyeglasses.   Use stronger or brighter lighting.   Ask your eye doctor about reducing your medicine dose or changing medicines  if it is thought that a medicine caused your cataract. Changing medicines does not make the cataract go away on its own.   Become familiar with your surroundings. Poor vision can lead to injury. Avoid bumping into things on the affected side. You are at a higher risk for tripping or falling.   Exercise extreme care when driving or operating machinery.   Wear sunglasses if you are sensitive to bright light or experiencing problems with glare.  SEEK IMMEDIATE MEDICAL CARE IF:   You have a worsening or sudden vision loss.   You notice  redness, swelling, or increasing pain in the eye.   You have a fever.  Document Released: 07/18/2005 Document Revised: 07/07/2011 Document Reviewed: 03/11/2011 [facility] Patient Information 2012 [place].PATIENT INSTRUCTIONS POST-ANESTHESIA  IMMEDIATELY FOLLOWING SURGERY:  Do not drive or operate machinery for the first twenty four hours after surgery.  Do not make any important decisions for twenty four hours after surgery or while taking narcotic pain medications or sedatives.  If you develop intractable nausea and vomiting or a severe headache please notify your doctor immediately.  FOLLOW-UP:  Please make an appointment with your surgeon as instructed. You do not need to follow up with anesthesia unless specifically instructed to do so.  WOUND CARE INSTRUCTIONS (if applicable):  Keep a dry clean dressing on the anesthesia/puncture wound site if there is drainage.  Once the wound has quit draining you may leave it open to air.  Generally you should leave the bandage intact for twenty four hours unless there is drainage.  If the epidural site drains for more than 36-48 hours please call the anesthesia department.  QUESTIONS?:  Please feel free to call your physician or the hospital operator if you have any questions, and they will be happy to assist you.

## 2013-09-05 ENCOUNTER — Encounter (HOSPITAL_COMMUNITY): Payer: Self-pay

## 2013-09-05 ENCOUNTER — Encounter (HOSPITAL_COMMUNITY)
Admission: RE | Admit: 2013-09-05 | Discharge: 2013-09-05 | Disposition: A | Discharge status: Home / Self Care | Payer: Medicare HMO | Source: Ambulatory Visit | Attending: Ophthalmology | Admitting: Ophthalmology

## 2013-09-05 ENCOUNTER — Other Ambulatory Visit: Payer: Self-pay

## 2013-09-05 DIAGNOSIS — Z01810 Encounter for preprocedural cardiovascular examination: Secondary | ICD-10-CM | POA: Insufficient documentation

## 2013-09-05 HISTORY — DX: Emphysema, unspecified: J43.9

## 2013-09-05 LAB — HEMOGLOBIN AND HEMATOCRIT, BLOOD
HCT: 41.8 % (ref 36.0–46.0)
HEMOGLOBIN: 13.5 g/dL (ref 12.0–15.0)

## 2013-09-05 LAB — BASIC METABOLIC PANEL
BUN: 12 mg/dL (ref 6–23)
CALCIUM: 9.1 mg/dL (ref 8.4–10.5)
CO2: 28 mEq/L (ref 19–32)
Chloride: 102 mEq/L (ref 96–112)
Creatinine, Ser: 0.67 mg/dL (ref 0.50–1.10)
Glucose, Bld: 121 mg/dL — ABNORMAL HIGH (ref 70–99)
POTASSIUM: 4 meq/L (ref 3.7–5.3)
SODIUM: 142 meq/L (ref 137–147)

## 2013-09-09 MED ORDER — TETRACAINE HCL 0.5 % OP SOLN
OPHTHALMIC | Status: AC
  Filled 2013-09-09: qty 2

## 2013-09-09 MED ORDER — PHENYLEPHRINE HCL 2.5 % OP SOLN
OPHTHALMIC | Status: AC
  Filled 2013-09-09: qty 15

## 2013-09-09 MED ORDER — KETOROLAC TROMETHAMINE 0.5 % OP SOLN
OPHTHALMIC | Status: AC
  Filled 2013-09-09: qty 5

## 2013-09-09 MED ORDER — CYCLOPENTOLATE-PHENYLEPHRINE OP SOLN OPTIME - NO CHARGE
OPHTHALMIC | Status: AC
  Filled 2013-09-09: qty 2

## 2013-09-10 ENCOUNTER — Ambulatory Visit (HOSPITAL_COMMUNITY): Payer: Medicare HMO | Admitting: Anesthesiology

## 2013-09-10 ENCOUNTER — Encounter (HOSPITAL_COMMUNITY)
Admission: RE | Disposition: A | Discharge status: Home / Self Care | Payer: Self-pay | Source: Ambulatory Visit | Attending: Ophthalmology

## 2013-09-10 ENCOUNTER — Encounter (HOSPITAL_COMMUNITY): Payer: Medicare HMO | Admitting: Anesthesiology

## 2013-09-10 ENCOUNTER — Ambulatory Visit (HOSPITAL_COMMUNITY)
Admission: RE | Admit: 2013-09-10 | Discharge: 2013-09-10 | Disposition: A | Discharge status: Home / Self Care | Payer: Medicare HMO | Source: Ambulatory Visit | Attending: Ophthalmology | Admitting: Ophthalmology

## 2013-09-10 ENCOUNTER — Encounter (HOSPITAL_COMMUNITY): Payer: Self-pay | Admitting: *Deleted

## 2013-09-10 DIAGNOSIS — H2510 Age-related nuclear cataract, unspecified eye: Secondary | ICD-10-CM | POA: Diagnosis present

## 2013-09-10 DIAGNOSIS — J449 Chronic obstructive pulmonary disease, unspecified: Secondary | ICD-10-CM | POA: Diagnosis not present

## 2013-09-10 DIAGNOSIS — E119 Type 2 diabetes mellitus without complications: Secondary | ICD-10-CM | POA: Diagnosis not present

## 2013-09-10 DIAGNOSIS — J4489 Other specified chronic obstructive pulmonary disease: Secondary | ICD-10-CM | POA: Insufficient documentation

## 2013-09-10 DIAGNOSIS — Z7982 Long term (current) use of aspirin: Secondary | ICD-10-CM | POA: Insufficient documentation

## 2013-09-10 DIAGNOSIS — F17200 Nicotine dependence, unspecified, uncomplicated: Secondary | ICD-10-CM | POA: Insufficient documentation

## 2013-09-10 HISTORY — PX: CATARACT EXTRACTION W/PHACO: SHX586

## 2013-09-10 LAB — GLUCOSE, CAPILLARY: Glucose-Capillary: 183 mg/dL — ABNORMAL HIGH (ref 70–99)

## 2013-09-10 SURGERY — PHACOEMULSIFICATION, CATARACT, WITH IOL INSERTION
Anesthesia: Monitor Anesthesia Care | Laterality: Right

## 2013-09-10 MED ORDER — TETRACAINE HCL 0.5 % OP SOLN
1.0000 [drp] | OPHTHALMIC | Status: AC
  Administered 2013-09-10 (×3): 1 [drp] via OPHTHALMIC

## 2013-09-10 MED ORDER — PHENYLEPHRINE HCL 2.5 % OP SOLN
1.0000 [drp] | OPHTHALMIC | Status: AC
  Administered 2013-09-10 (×3): 1 [drp] via OPHTHALMIC

## 2013-09-10 MED ORDER — LACTATED RINGERS IV SOLN
INTRAVENOUS | Status: DC
  Administered 2013-09-10: 07:00:00 via INTRAVENOUS

## 2013-09-10 MED ORDER — LIDOCAINE HCL (PF) 1 % IJ SOLN
INTRAMUSCULAR | Status: AC
  Filled 2013-09-10: qty 2

## 2013-09-10 MED ORDER — EPINEPHRINE HCL 1 MG/ML IJ SOLN
INTRAOCULAR | Status: DC | PRN
  Administered 2013-09-10: 08:00:00

## 2013-09-10 MED ORDER — MIDAZOLAM HCL 2 MG/2ML IJ SOLN
INTRAMUSCULAR | Status: AC
  Filled 2013-09-10: qty 2

## 2013-09-10 MED ORDER — FENTANYL CITRATE 0.05 MG/ML IJ SOLN: 25.0000 ug | INTRAMUSCULAR | Status: DC | PRN

## 2013-09-10 MED ORDER — ONDANSETRON HCL 4 MG/2ML IJ SOLN: 4.0000 mg | Freq: Once | INTRAMUSCULAR | Status: DC | PRN

## 2013-09-10 MED ORDER — BSS IO SOLN
INTRAOCULAR | Status: DC | PRN
  Administered 2013-09-10: 15 mL via INTRAOCULAR

## 2013-09-10 MED ORDER — PROVISC 10 MG/ML IO SOLN
INTRAOCULAR | Status: DC | PRN
  Administered 2013-09-10: 0.85 mL via INTRAOCULAR

## 2013-09-10 MED ORDER — KETOROLAC TROMETHAMINE 0.5 % OP SOLN
1.0000 [drp] | OPHTHALMIC | Status: AC
  Administered 2013-09-10 (×3): 1 [drp] via OPHTHALMIC

## 2013-09-10 MED ORDER — EPINEPHRINE HCL 1 MG/ML IJ SOLN
INTRAMUSCULAR | Status: AC
  Filled 2013-09-10: qty 1

## 2013-09-10 MED ORDER — CYCLOPENTOLATE-PHENYLEPHRINE 0.2-1 % OP SOLN
1.0000 [drp] | OPHTHALMIC | Status: AC
  Administered 2013-09-10 (×3): 1 [drp] via OPHTHALMIC

## 2013-09-10 MED ORDER — MIDAZOLAM HCL 2 MG/2ML IJ SOLN
1.0000 mg | INTRAMUSCULAR | Status: DC | PRN
  Administered 2013-09-10: 2 mg via INTRAVENOUS

## 2013-09-10 MED ORDER — FENTANYL CITRATE 0.05 MG/ML IJ SOLN
25.0000 ug | INTRAMUSCULAR | Status: AC
  Administered 2013-09-10: 25 ug via INTRAVENOUS
  Filled 2013-09-10: qty 2

## 2013-09-10 SURGICAL SUPPLY — 24 items
CAPSULAR TENSION RING-AMO (OPHTHALMIC RELATED) IMPLANT
CLOTH BEACON ORANGE TIMEOUT ST (SAFETY) ×1 IMPLANT
EYE SHIELD UNIVERSAL CLEAR (GAUZE/BANDAGES/DRESSINGS) ×1 IMPLANT
GLOVE BIO SURGEON STRL SZ 6.5 (GLOVE) IMPLANT
GLOVE ECLIPSE 6.5 STRL STRAW (GLOVE) IMPLANT
GLOVE ECLIPSE 7.0 STRL STRAW (GLOVE) IMPLANT
GLOVE EXAM NITRILE LRG STRL (GLOVE) IMPLANT
GLOVE EXAM NITRILE MD LF STRL (GLOVE) IMPLANT
GLOVE INDICATOR 6.5 STRL GRN (GLOVE) ×1 IMPLANT
GLOVE SKINSENSE NS SZ6.5 (GLOVE)
GLOVE SKINSENSE STRL SZ6.5 (GLOVE) IMPLANT
HEALON 5 0.6 ML (INTRAOCULAR LENS) IMPLANT
KIT VITRECTOMY (OPHTHALMIC RELATED) IMPLANT
PAD ARMBOARD 7.5X6 YLW CONV (MISCELLANEOUS) ×1 IMPLANT
PROC W NO LENS (INTRAOCULAR LENS)
PROC W SPEC LENS (INTRAOCULAR LENS)
PROCESS W NO LENS (INTRAOCULAR LENS) IMPLANT
PROCESS W SPEC LENS (INTRAOCULAR LENS) IMPLANT
RING MALYGIN (MISCELLANEOUS) IMPLANT
SIGHTPATH CAT PROC W REG LENS (Ophthalmic Related) ×2 IMPLANT
TAPE SURG TRANSPORE 1 IN (GAUZE/BANDAGES/DRESSINGS) IMPLANT
TAPE SURGICAL TRANSPORE 1 IN (GAUZE/BANDAGES/DRESSINGS) ×1
VISCOELASTIC ADDITIONAL (OPHTHALMIC RELATED) IMPLANT
WATER STERILE IRR 250ML POUR (IV SOLUTION) ×1 IMPLANT

## 2013-09-10 NOTE — Transfer of Care (Signed)
Immediate Anesthesia Transfer of Care Note  Patient: [name]  Procedure(s) Performed: Procedure(s) with comments: CATARACT EXTRACTION PHACO AND INTRAOCULAR LENS PLACEMENT (IOC) (Right) - CDE:12.81  Patient Location: Short Stay  Anesthesia Type:MAC  Level of Consciousness: awake  Airway & Oxygen Therapy: Patient Spontanous Breathing  Post-op Assessment: Report given to PACU RN  Post vital signs: Reviewed  Complications: No apparent anesthesia complications

## 2013-09-10 NOTE — Anesthesia Preprocedure Evaluation (Signed)
Anesthesia Evaluation  Patient identified by MRN, date of birth, ID band Patient awake    Reviewed: Allergy & Precautions, H&P , NPO status , Patient's Chart, lab work & pertinent test results, reviewed documented beta blocker date and time   Airway Mallampati: II TM Distance: >3 FB Neck ROM: full    Dental   Pulmonary shortness of breath and with exertion, COPDCurrent Smoker,  breath sounds clear to auscultation        Cardiovascular negative cardio ROS  Rhythm:Regular Rate:Normal     Neuro/Psych PSYCHIATRIC DISORDERS Anxiety  Neuromuscular disease negative psych ROS   GI/Hepatic negative GI ROS, Neg liver ROS,   Endo/Other  diabetes, Type 2, Oral Hypoglycemic Agents  Renal/GU negative Renal ROS  negative genitourinary   Musculoskeletal   Abdominal   Peds  Hematology negative hematology ROS (+)   Anesthesia Other Findings   Reproductive/Obstetrics negative OB ROS                           Anesthesia Physical Anesthesia Plan  ASA: III  Anesthesia Plan: MAC   Post-op Pain Management:    Induction: Intravenous  Airway Management Planned: Nasal Cannula  Additional Equipment:   Intra-op Plan:   Post-operative Plan:   Informed Consent: I have reviewed the patients History and Physical, chart, labs and discussed the procedure including the risks, benefits and alternatives for the proposed anesthesia with the patient or authorized representative who has indicated his/her understanding and acceptance.     Plan Discussed with:   Anesthesia Plan Comments:         Anesthesia Quick Evaluation

## 2013-09-10 NOTE — Anesthesia Postprocedure Evaluation (Signed)
  Anesthesia Post-op Note  Patient: [name]  Procedure(s) Performed: Procedure(s) with comments: CATARACT EXTRACTION PHACO AND INTRAOCULAR LENS PLACEMENT (IOC) (Right) - CDE:12.81  Patient Location: Short Stay  Anesthesia Type:MAC  Level of Consciousness: awake, alert  and oriented  Airway and Oxygen Therapy: Patient Spontanous Breathing  Post-op Pain: none  Post-op Assessment: Post-op Vital signs reviewed, Patient's Cardiovascular Status Stable, Respiratory Function Stable, Patent Airway and No signs of Nausea or vomiting  Post-op Vital Signs: Reviewed and stable  Complications: No apparent anesthesia complications

## 2013-09-10 NOTE — Op Note (Signed)
Patient brought to the operating room and prepped and draped in the usual manner.  Lid speculum inserted in right eye.  Stab incision made at the twelve o'clock position.  Provisc instilled in the anterior chamber.   A 2.4 mm. Stab incision was made temporally.  An anterior capsulotomy was done with a bent 25 gauge needle.  The nucleus was hydrodissected.  The Phaco tip was inserted in the anterior chamber and the nucleus was emulsified.  CDE was 12.81.  The cortical material was then removed with the I and A tip.  Posterior capsule was the polished.  The anterior chamber was deepened with Provisc.  A 20.0 Diopter Rayner 570C IOL was then inserted in the capsular bag.  Provisc was then removed with the I and A tip.  The wound was then hydrated.  Patient sent to the Recovery Room in good condition with follow up in my office.  Preoperative Diagnosis:  Nuclear Cataract OD Postoperative Diagnosis:  Same Procedure name: Kelman Phacoemulsification OD with IOL

## 2013-09-10 NOTE — H&P (Signed)
The patient was re examined and there is no change in the patients condition since the original H and P. 

## 2013-09-10 NOTE — Discharge Instructions (Signed)
[name]  09/10/2013           [facility] Instructions [place] [address]-      1. Avoid closing eyes tightly. One often closes the eye tightly when laughing, talking, sneezing, coughing or if they feel irritated. At these times, you should be careful not to close your eyes tightly.  2. Instill eye drops as instructed. To instill drops in your eye, open it, look up and have someone gently pull the lower lid down and instill a couple of drops inside the lower lid.  3. Do not touch upper lid.  4. Take Advil or Tylenol for pain.  5. You may use either eye for near work, such as reading or sewing and you may watch television.  6. You may have your hair done at the beauty parlor at any time.  7. Wear dark glasses with or without your own glasses if you are in bright light.  8. Call our office at [phone] or [phone] if you have sharp pain in your eye or unusual symptoms.  9. Do not be concerned because vision in the operative eye is not good. It will not be good, no matter how successful the operation, until you get a special lens for it. Your old glasses will not be suited to the new eye that was operated on and you will not be ready for a new lens for about a month.  10. Follow up at the [facility] office.    I have received a copy of the above instructions and will follow them.      Follow Up appointment today with Dr. Gershon Crane at 2:00 pm.

## 2013-09-11 ENCOUNTER — Encounter (HOSPITAL_COMMUNITY): Payer: Self-pay | Admitting: Ophthalmology

## 2015-05-28 DIAGNOSIS — Z23 Encounter for immunization: Secondary | ICD-10-CM | POA: Diagnosis not present

## 2015-08-11 DIAGNOSIS — E118 Type 2 diabetes mellitus with unspecified complications: Secondary | ICD-10-CM | POA: Diagnosis not present

## 2015-10-29 DIAGNOSIS — E118 Type 2 diabetes mellitus with unspecified complications: Secondary | ICD-10-CM | POA: Diagnosis not present

## 2015-11-04 DIAGNOSIS — E119 Type 2 diabetes mellitus without complications: Secondary | ICD-10-CM | POA: Diagnosis not present

## 2015-11-04 DIAGNOSIS — Z7984 Long term (current) use of oral hypoglycemic drugs: Secondary | ICD-10-CM | POA: Diagnosis not present

## 2015-11-04 DIAGNOSIS — H524 Presbyopia: Secondary | ICD-10-CM | POA: Diagnosis not present

## 2015-11-10 DIAGNOSIS — D0592 Unspecified type of carcinoma in situ of left breast: Secondary | ICD-10-CM | POA: Diagnosis not present

## 2015-11-10 DIAGNOSIS — K219 Gastro-esophageal reflux disease without esophagitis: Secondary | ICD-10-CM | POA: Diagnosis not present

## 2015-11-10 DIAGNOSIS — R69 Illness, unspecified: Secondary | ICD-10-CM | POA: Diagnosis not present

## 2015-11-10 DIAGNOSIS — E1165 Type 2 diabetes mellitus with hyperglycemia: Secondary | ICD-10-CM | POA: Diagnosis not present

## 2015-11-17 DIAGNOSIS — Z0000 Encounter for general adult medical examination without abnormal findings: Secondary | ICD-10-CM | POA: Diagnosis not present

## 2016-01-25 DIAGNOSIS — E118 Type 2 diabetes mellitus with unspecified complications: Secondary | ICD-10-CM | POA: Diagnosis not present

## 2016-03-25 DIAGNOSIS — Z1231 Encounter for screening mammogram for malignant neoplasm of breast: Secondary | ICD-10-CM | POA: Diagnosis not present

## 2016-05-13 DIAGNOSIS — R69 Illness, unspecified: Secondary | ICD-10-CM | POA: Diagnosis not present

## 2016-05-17 DIAGNOSIS — Z23 Encounter for immunization: Secondary | ICD-10-CM | POA: Diagnosis not present

## 2016-08-16 DIAGNOSIS — R69 Illness, unspecified: Secondary | ICD-10-CM | POA: Diagnosis not present

## 2016-09-15 DIAGNOSIS — Z7982 Long term (current) use of aspirin: Secondary | ICD-10-CM | POA: Diagnosis not present

## 2016-09-15 DIAGNOSIS — Z6831 Body mass index (BMI) 31.0-31.9, adult: Secondary | ICD-10-CM | POA: Diagnosis not present

## 2016-09-15 DIAGNOSIS — E119 Type 2 diabetes mellitus without complications: Secondary | ICD-10-CM | POA: Diagnosis not present

## 2016-09-15 DIAGNOSIS — Z7984 Long term (current) use of oral hypoglycemic drugs: Secondary | ICD-10-CM | POA: Diagnosis not present

## 2016-09-15 DIAGNOSIS — E669 Obesity, unspecified: Secondary | ICD-10-CM | POA: Diagnosis not present

## 2016-09-15 DIAGNOSIS — K219 Gastro-esophageal reflux disease without esophagitis: Secondary | ICD-10-CM | POA: Diagnosis not present

## 2016-09-15 DIAGNOSIS — R69 Illness, unspecified: Secondary | ICD-10-CM | POA: Diagnosis not present

## 2016-09-15 DIAGNOSIS — Z0000 Encounter for general adult medical examination without abnormal findings: Secondary | ICD-10-CM | POA: Diagnosis not present

## 2016-11-09 DIAGNOSIS — R69 Illness, unspecified: Secondary | ICD-10-CM | POA: Diagnosis not present

## 2016-11-25 DIAGNOSIS — E1165 Type 2 diabetes mellitus with hyperglycemia: Secondary | ICD-10-CM | POA: Diagnosis not present

## 2016-11-25 DIAGNOSIS — R69 Illness, unspecified: Secondary | ICD-10-CM | POA: Diagnosis not present

## 2016-11-25 DIAGNOSIS — E1140 Type 2 diabetes mellitus with diabetic neuropathy, unspecified: Secondary | ICD-10-CM | POA: Diagnosis not present

## 2016-11-25 DIAGNOSIS — K219 Gastro-esophageal reflux disease without esophagitis: Secondary | ICD-10-CM | POA: Diagnosis not present

## 2016-11-30 DIAGNOSIS — E1140 Type 2 diabetes mellitus with diabetic neuropathy, unspecified: Secondary | ICD-10-CM | POA: Diagnosis not present

## 2016-11-30 DIAGNOSIS — K219 Gastro-esophageal reflux disease without esophagitis: Secondary | ICD-10-CM | POA: Diagnosis not present

## 2016-11-30 DIAGNOSIS — Z1389 Encounter for screening for other disorder: Secondary | ICD-10-CM | POA: Diagnosis not present

## 2016-11-30 DIAGNOSIS — Z0000 Encounter for general adult medical examination without abnormal findings: Secondary | ICD-10-CM | POA: Diagnosis not present

## 2016-11-30 DIAGNOSIS — D0592 Unspecified type of carcinoma in situ of left breast: Secondary | ICD-10-CM | POA: Diagnosis not present

## 2016-11-30 DIAGNOSIS — E1165 Type 2 diabetes mellitus with hyperglycemia: Secondary | ICD-10-CM | POA: Diagnosis not present

## 2016-12-14 DIAGNOSIS — M720 Palmar fascial fibromatosis [Dupuytren]: Secondary | ICD-10-CM | POA: Diagnosis not present

## 2017-01-03 DIAGNOSIS — R69 Illness, unspecified: Secondary | ICD-10-CM | POA: Diagnosis not present

## 2017-02-06 DIAGNOSIS — Z6831 Body mass index (BMI) 31.0-31.9, adult: Secondary | ICD-10-CM | POA: Diagnosis not present

## 2017-02-06 DIAGNOSIS — S2002XA Contusion of left breast, initial encounter: Secondary | ICD-10-CM | POA: Diagnosis not present

## 2017-02-15 DIAGNOSIS — N6489 Other specified disorders of breast: Secondary | ICD-10-CM | POA: Diagnosis not present

## 2017-02-15 DIAGNOSIS — I808 Phlebitis and thrombophlebitis of other sites: Secondary | ICD-10-CM | POA: Diagnosis not present

## 2017-02-15 DIAGNOSIS — Z923 Personal history of irradiation: Secondary | ICD-10-CM | POA: Diagnosis not present

## 2017-02-15 DIAGNOSIS — R928 Other abnormal and inconclusive findings on diagnostic imaging of breast: Secondary | ICD-10-CM | POA: Diagnosis not present

## 2017-02-15 DIAGNOSIS — Z853 Personal history of malignant neoplasm of breast: Secondary | ICD-10-CM | POA: Diagnosis not present

## 2017-03-27 DIAGNOSIS — R69 Illness, unspecified: Secondary | ICD-10-CM | POA: Diagnosis not present

## 2017-06-21 DIAGNOSIS — R69 Illness, unspecified: Secondary | ICD-10-CM | POA: Diagnosis not present

## 2017-06-26 DIAGNOSIS — M810 Age-related osteoporosis without current pathological fracture: Secondary | ICD-10-CM | POA: Diagnosis not present

## 2017-06-26 DIAGNOSIS — M818 Other osteoporosis without current pathological fracture: Secondary | ICD-10-CM | POA: Diagnosis not present

## 2017-06-26 DIAGNOSIS — M8589 Other specified disorders of bone density and structure, multiple sites: Secondary | ICD-10-CM | POA: Diagnosis not present

## 2017-07-04 DIAGNOSIS — Z23 Encounter for immunization: Secondary | ICD-10-CM | POA: Diagnosis not present

## 2017-07-26 DIAGNOSIS — R05 Cough: Secondary | ICD-10-CM | POA: Diagnosis not present

## 2017-07-26 DIAGNOSIS — D649 Anemia, unspecified: Secondary | ICD-10-CM | POA: Diagnosis not present

## 2017-07-26 DIAGNOSIS — Z79899 Other long term (current) drug therapy: Secondary | ICD-10-CM | POA: Diagnosis not present

## 2017-07-26 DIAGNOSIS — R0602 Shortness of breath: Secondary | ICD-10-CM | POA: Diagnosis not present

## 2017-07-26 DIAGNOSIS — Z720 Tobacco use: Secondary | ICD-10-CM | POA: Diagnosis not present

## 2017-07-26 DIAGNOSIS — D709 Neutropenia, unspecified: Secondary | ICD-10-CM | POA: Diagnosis not present

## 2017-07-26 DIAGNOSIS — D696 Thrombocytopenia, unspecified: Secondary | ICD-10-CM | POA: Diagnosis not present

## 2017-07-26 DIAGNOSIS — E119 Type 2 diabetes mellitus without complications: Secondary | ICD-10-CM | POA: Diagnosis not present

## 2017-07-26 DIAGNOSIS — J441 Chronic obstructive pulmonary disease with (acute) exacerbation: Secondary | ICD-10-CM | POA: Diagnosis not present

## 2017-07-26 DIAGNOSIS — M1990 Unspecified osteoarthritis, unspecified site: Secondary | ICD-10-CM | POA: Diagnosis not present

## 2017-07-26 DIAGNOSIS — K219 Gastro-esophageal reflux disease without esophagitis: Secondary | ICD-10-CM | POA: Diagnosis not present

## 2017-07-26 DIAGNOSIS — R69 Illness, unspecified: Secondary | ICD-10-CM | POA: Diagnosis not present

## 2017-07-27 DIAGNOSIS — J441 Chronic obstructive pulmonary disease with (acute) exacerbation: Secondary | ICD-10-CM | POA: Diagnosis not present

## 2017-07-27 DIAGNOSIS — D649 Anemia, unspecified: Secondary | ICD-10-CM | POA: Diagnosis not present

## 2017-07-27 DIAGNOSIS — D696 Thrombocytopenia, unspecified: Secondary | ICD-10-CM | POA: Diagnosis not present

## 2017-07-27 DIAGNOSIS — R69 Illness, unspecified: Secondary | ICD-10-CM | POA: Diagnosis not present

## 2017-07-28 DIAGNOSIS — J441 Chronic obstructive pulmonary disease with (acute) exacerbation: Secondary | ICD-10-CM | POA: Diagnosis not present

## 2017-08-27 DIAGNOSIS — J019 Acute sinusitis, unspecified: Secondary | ICD-10-CM | POA: Diagnosis not present

## 2017-08-27 DIAGNOSIS — J441 Chronic obstructive pulmonary disease with (acute) exacerbation: Secondary | ICD-10-CM | POA: Diagnosis not present

## 2017-08-27 DIAGNOSIS — Z6831 Body mass index (BMI) 31.0-31.9, adult: Secondary | ICD-10-CM | POA: Diagnosis not present

## 2017-08-28 DIAGNOSIS — E1165 Type 2 diabetes mellitus with hyperglycemia: Secondary | ICD-10-CM | POA: Diagnosis not present

## 2017-08-28 DIAGNOSIS — J449 Chronic obstructive pulmonary disease, unspecified: Secondary | ICD-10-CM | POA: Diagnosis not present

## 2017-08-28 DIAGNOSIS — Z8249 Family history of ischemic heart disease and other diseases of the circulatory system: Secondary | ICD-10-CM | POA: Diagnosis not present

## 2017-08-28 DIAGNOSIS — R32 Unspecified urinary incontinence: Secondary | ICD-10-CM | POA: Diagnosis not present

## 2017-08-28 DIAGNOSIS — R69 Illness, unspecified: Secondary | ICD-10-CM | POA: Diagnosis not present

## 2017-08-28 DIAGNOSIS — Z7982 Long term (current) use of aspirin: Secondary | ICD-10-CM | POA: Diagnosis not present

## 2017-08-28 DIAGNOSIS — E669 Obesity, unspecified: Secondary | ICD-10-CM | POA: Diagnosis not present

## 2017-08-28 DIAGNOSIS — Z6832 Body mass index (BMI) 32.0-32.9, adult: Secondary | ICD-10-CM | POA: Diagnosis not present

## 2017-08-28 DIAGNOSIS — Z7984 Long term (current) use of oral hypoglycemic drugs: Secondary | ICD-10-CM | POA: Diagnosis not present

## 2017-08-28 DIAGNOSIS — K219 Gastro-esophageal reflux disease without esophagitis: Secondary | ICD-10-CM | POA: Diagnosis not present

## 2017-08-28 DIAGNOSIS — J441 Chronic obstructive pulmonary disease with (acute) exacerbation: Secondary | ICD-10-CM | POA: Diagnosis not present

## 2017-09-18 DIAGNOSIS — R69 Illness, unspecified: Secondary | ICD-10-CM | POA: Diagnosis not present

## 2017-09-28 DIAGNOSIS — J441 Chronic obstructive pulmonary disease with (acute) exacerbation: Secondary | ICD-10-CM | POA: Diagnosis not present

## 2017-10-26 DIAGNOSIS — J441 Chronic obstructive pulmonary disease with (acute) exacerbation: Secondary | ICD-10-CM | POA: Diagnosis not present

## 2017-11-26 DIAGNOSIS — J441 Chronic obstructive pulmonary disease with (acute) exacerbation: Secondary | ICD-10-CM | POA: Diagnosis not present

## 2017-12-04 DIAGNOSIS — M810 Age-related osteoporosis without current pathological fracture: Secondary | ICD-10-CM | POA: Diagnosis not present

## 2017-12-04 DIAGNOSIS — D519 Vitamin B12 deficiency anemia, unspecified: Secondary | ICD-10-CM | POA: Diagnosis not present

## 2017-12-04 DIAGNOSIS — K219 Gastro-esophageal reflux disease without esophagitis: Secondary | ICD-10-CM | POA: Diagnosis not present

## 2017-12-04 DIAGNOSIS — E559 Vitamin D deficiency, unspecified: Secondary | ICD-10-CM | POA: Diagnosis not present

## 2017-12-04 DIAGNOSIS — E1140 Type 2 diabetes mellitus with diabetic neuropathy, unspecified: Secondary | ICD-10-CM | POA: Diagnosis not present

## 2017-12-04 DIAGNOSIS — E1165 Type 2 diabetes mellitus with hyperglycemia: Secondary | ICD-10-CM | POA: Diagnosis not present

## 2017-12-04 DIAGNOSIS — J441 Chronic obstructive pulmonary disease with (acute) exacerbation: Secondary | ICD-10-CM | POA: Diagnosis not present

## 2017-12-07 DIAGNOSIS — K219 Gastro-esophageal reflux disease without esophagitis: Secondary | ICD-10-CM | POA: Diagnosis not present

## 2017-12-07 DIAGNOSIS — D0592 Unspecified type of carcinoma in situ of left breast: Secondary | ICD-10-CM | POA: Diagnosis not present

## 2017-12-07 DIAGNOSIS — E1165 Type 2 diabetes mellitus with hyperglycemia: Secondary | ICD-10-CM | POA: Diagnosis not present

## 2017-12-07 DIAGNOSIS — J441 Chronic obstructive pulmonary disease with (acute) exacerbation: Secondary | ICD-10-CM | POA: Diagnosis not present

## 2017-12-07 DIAGNOSIS — Z6831 Body mass index (BMI) 31.0-31.9, adult: Secondary | ICD-10-CM | POA: Diagnosis not present

## 2017-12-07 DIAGNOSIS — Z0001 Encounter for general adult medical examination with abnormal findings: Secondary | ICD-10-CM | POA: Diagnosis not present

## 2017-12-07 DIAGNOSIS — E1140 Type 2 diabetes mellitus with diabetic neuropathy, unspecified: Secondary | ICD-10-CM | POA: Diagnosis not present

## 2017-12-18 DIAGNOSIS — M720 Palmar fascial fibromatosis [Dupuytren]: Secondary | ICD-10-CM | POA: Diagnosis not present

## 2017-12-18 DIAGNOSIS — R200 Anesthesia of skin: Secondary | ICD-10-CM | POA: Diagnosis not present

## 2017-12-25 DIAGNOSIS — Z7982 Long term (current) use of aspirin: Secondary | ICD-10-CM | POA: Diagnosis not present

## 2017-12-25 DIAGNOSIS — Z7984 Long term (current) use of oral hypoglycemic drugs: Secondary | ICD-10-CM | POA: Diagnosis not present

## 2017-12-25 DIAGNOSIS — J449 Chronic obstructive pulmonary disease, unspecified: Secondary | ICD-10-CM | POA: Diagnosis not present

## 2017-12-25 DIAGNOSIS — K219 Gastro-esophageal reflux disease without esophagitis: Secondary | ICD-10-CM | POA: Diagnosis not present

## 2017-12-25 DIAGNOSIS — Z79899 Other long term (current) drug therapy: Secondary | ICD-10-CM | POA: Diagnosis not present

## 2017-12-25 DIAGNOSIS — R69 Illness, unspecified: Secondary | ICD-10-CM | POA: Diagnosis not present

## 2017-12-25 DIAGNOSIS — T7840XA Allergy, unspecified, initial encounter: Secondary | ICD-10-CM | POA: Diagnosis not present

## 2017-12-25 DIAGNOSIS — E119 Type 2 diabetes mellitus without complications: Secondary | ICD-10-CM | POA: Diagnosis not present

## 2017-12-25 DIAGNOSIS — X58XXXA Exposure to other specified factors, initial encounter: Secondary | ICD-10-CM | POA: Diagnosis not present

## 2017-12-25 DIAGNOSIS — M1990 Unspecified osteoarthritis, unspecified site: Secondary | ICD-10-CM | POA: Diagnosis not present

## 2017-12-26 DIAGNOSIS — J441 Chronic obstructive pulmonary disease with (acute) exacerbation: Secondary | ICD-10-CM | POA: Diagnosis not present

## 2017-12-29 DIAGNOSIS — R69 Illness, unspecified: Secondary | ICD-10-CM | POA: Diagnosis not present

## 2018-01-10 DIAGNOSIS — G5602 Carpal tunnel syndrome, left upper limb: Secondary | ICD-10-CM | POA: Diagnosis not present

## 2018-01-10 DIAGNOSIS — G5622 Lesion of ulnar nerve, left upper limb: Secondary | ICD-10-CM | POA: Diagnosis not present

## 2018-01-10 DIAGNOSIS — M720 Palmar fascial fibromatosis [Dupuytren]: Secondary | ICD-10-CM | POA: Diagnosis not present

## 2018-01-11 ENCOUNTER — Other Ambulatory Visit: Payer: Self-pay | Admitting: Orthopedic Surgery

## 2018-01-26 DIAGNOSIS — J441 Chronic obstructive pulmonary disease with (acute) exacerbation: Secondary | ICD-10-CM | POA: Diagnosis not present

## 2018-01-30 DIAGNOSIS — J441 Chronic obstructive pulmonary disease with (acute) exacerbation: Secondary | ICD-10-CM | POA: Diagnosis not present

## 2018-01-30 DIAGNOSIS — Z6830 Body mass index (BMI) 30.0-30.9, adult: Secondary | ICD-10-CM | POA: Diagnosis not present

## 2018-01-30 DIAGNOSIS — E1165 Type 2 diabetes mellitus with hyperglycemia: Secondary | ICD-10-CM | POA: Diagnosis not present

## 2018-02-05 ENCOUNTER — Other Ambulatory Visit: Payer: Self-pay

## 2018-02-05 ENCOUNTER — Encounter (HOSPITAL_BASED_OUTPATIENT_CLINIC_OR_DEPARTMENT_OTHER): Payer: Self-pay | Admitting: *Deleted

## 2018-02-08 ENCOUNTER — Encounter (HOSPITAL_BASED_OUTPATIENT_CLINIC_OR_DEPARTMENT_OTHER)
Admission: RE | Admit: 2018-02-08 | Discharge: 2018-02-08 | Disposition: A | Discharge status: Home / Self Care | Payer: Medicare HMO | Source: Ambulatory Visit | Attending: Orthopedic Surgery | Admitting: Orthopedic Surgery

## 2018-02-08 DIAGNOSIS — Z01812 Encounter for preprocedural laboratory examination: Secondary | ICD-10-CM | POA: Insufficient documentation

## 2018-02-08 LAB — BASIC METABOLIC PANEL
Anion gap: 6 (ref 5–15)
BUN: 14 mg/dL (ref 8–23)
CALCIUM: 9.2 mg/dL (ref 8.9–10.3)
CO2: 28 mmol/L (ref 22–32)
CREATININE: 0.68 mg/dL (ref 0.44–1.00)
Chloride: 107 mmol/L (ref 98–111)
GFR calc non Af Amer: >60 mL/min (ref >60)
GLUCOSE: 113 mg/dL — AB (ref 70–99)
Potassium: 4.6 mmol/L (ref 3.5–5.1)
Sodium: 141 mmol/L (ref 135–145)

## 2018-02-13 ENCOUNTER — Other Ambulatory Visit: Payer: Self-pay

## 2018-02-13 ENCOUNTER — Ambulatory Visit (HOSPITAL_BASED_OUTPATIENT_CLINIC_OR_DEPARTMENT_OTHER): Payer: Medicare HMO | Admitting: Anesthesiology

## 2018-02-13 ENCOUNTER — Encounter (HOSPITAL_BASED_OUTPATIENT_CLINIC_OR_DEPARTMENT_OTHER): Payer: Self-pay | Admitting: *Deleted

## 2018-02-13 ENCOUNTER — Encounter (HOSPITAL_BASED_OUTPATIENT_CLINIC_OR_DEPARTMENT_OTHER)
Admission: RE | Disposition: A | Discharge status: Home / Self Care | Payer: Self-pay | Source: Ambulatory Visit | Attending: Orthopedic Surgery

## 2018-02-13 ENCOUNTER — Ambulatory Visit (HOSPITAL_BASED_OUTPATIENT_CLINIC_OR_DEPARTMENT_OTHER)
Admission: RE | Admit: 2018-02-13 | Discharge: 2018-02-13 | Disposition: A | Discharge status: Home / Self Care | Payer: Medicare HMO | Source: Ambulatory Visit | Attending: Orthopedic Surgery | Admitting: Orthopedic Surgery

## 2018-02-13 DIAGNOSIS — E119 Type 2 diabetes mellitus without complications: Secondary | ICD-10-CM | POA: Diagnosis not present

## 2018-02-13 DIAGNOSIS — K219 Gastro-esophageal reflux disease without esophagitis: Secondary | ICD-10-CM | POA: Diagnosis not present

## 2018-02-13 DIAGNOSIS — Z7984 Long term (current) use of oral hypoglycemic drugs: Secondary | ICD-10-CM | POA: Insufficient documentation

## 2018-02-13 DIAGNOSIS — G5602 Carpal tunnel syndrome, left upper limb: Secondary | ICD-10-CM | POA: Diagnosis not present

## 2018-02-13 DIAGNOSIS — Z79899 Other long term (current) drug therapy: Secondary | ICD-10-CM | POA: Diagnosis not present

## 2018-02-13 DIAGNOSIS — J449 Chronic obstructive pulmonary disease, unspecified: Secondary | ICD-10-CM | POA: Insufficient documentation

## 2018-02-13 DIAGNOSIS — G5622 Lesion of ulnar nerve, left upper limb: Secondary | ICD-10-CM | POA: Insufficient documentation

## 2018-02-13 DIAGNOSIS — Z9981 Dependence on supplemental oxygen: Secondary | ICD-10-CM | POA: Insufficient documentation

## 2018-02-13 DIAGNOSIS — M720 Palmar fascial fibromatosis [Dupuytren]: Secondary | ICD-10-CM | POA: Diagnosis not present

## 2018-02-13 DIAGNOSIS — R69 Illness, unspecified: Secondary | ICD-10-CM | POA: Diagnosis not present

## 2018-02-13 DIAGNOSIS — F17210 Nicotine dependence, cigarettes, uncomplicated: Secondary | ICD-10-CM | POA: Diagnosis not present

## 2018-02-13 DIAGNOSIS — G5601 Carpal tunnel syndrome, right upper limb: Secondary | ICD-10-CM | POA: Diagnosis not present

## 2018-02-13 DIAGNOSIS — G8918 Other acute postprocedural pain: Secondary | ICD-10-CM | POA: Diagnosis not present

## 2018-02-13 DIAGNOSIS — F419 Anxiety disorder, unspecified: Secondary | ICD-10-CM | POA: Insufficient documentation

## 2018-02-13 HISTORY — PX: FASCIECTOMY: SHX6525

## 2018-02-13 HISTORY — PX: ULNAR NERVE TRANSPOSITION: SHX2595

## 2018-02-13 HISTORY — PX: CARPAL TUNNEL RELEASE: SHX101

## 2018-02-13 LAB — GLUCOSE, CAPILLARY
Glucose-Capillary: 145 mg/dL — ABNORMAL HIGH (ref 70–99)
Glucose-Capillary: 153 mg/dL — ABNORMAL HIGH (ref 70–99)

## 2018-02-13 SURGERY — FASCIECTOMY, PALM
Anesthesia: General | Site: Wrist | Laterality: Left

## 2018-02-13 MED ORDER — LACTATED RINGERS IV SOLN
INTRAVENOUS | Status: DC
  Administered 2018-02-13 (×2): via INTRAVENOUS

## 2018-02-13 MED ORDER — ONDANSETRON HCL 4 MG/2ML IJ SOLN
INTRAMUSCULAR | Status: DC | PRN
  Administered 2018-02-13: 4 mg via INTRAVENOUS

## 2018-02-13 MED ORDER — CEFAZOLIN SODIUM-DEXTROSE 2-4 GM/100ML-% IV SOLN
INTRAVENOUS | Status: AC
  Filled 2018-02-13: qty 100

## 2018-02-13 MED ORDER — MIDAZOLAM HCL 2 MG/2ML IJ SOLN
INTRAMUSCULAR | Status: AC
  Filled 2018-02-13: qty 2

## 2018-02-13 MED ORDER — CEFAZOLIN SODIUM-DEXTROSE 2-4 GM/100ML-% IV SOLN
2.0000 g | INTRAVENOUS | Status: AC
  Administered 2018-02-13: 2 g via INTRAVENOUS

## 2018-02-13 MED ORDER — FENTANYL CITRATE (PF) 100 MCG/2ML IJ SOLN: 25.0000 ug | INTRAMUSCULAR | Status: DC | PRN

## 2018-02-13 MED ORDER — OXYCODONE-ACETAMINOPHEN 5-325 MG PO TABS: 1.0000 | ORAL_TABLET | ORAL | 0 refills | Status: AC | PRN

## 2018-02-13 MED ORDER — EPHEDRINE SULFATE 50 MG/ML IJ SOLN
INTRAMUSCULAR | Status: DC | PRN
  Administered 2018-02-13: 10 mg via INTRAVENOUS

## 2018-02-13 MED ORDER — ROPIVACAINE HCL 5 MG/ML IJ SOLN
INTRAMUSCULAR | Status: DC | PRN
  Administered 2018-02-13: 30 mL via PERINEURAL

## 2018-02-13 MED ORDER — ONDANSETRON HCL 4 MG/2ML IJ SOLN: 4.0000 mg | Freq: Once | INTRAMUSCULAR | Status: DC | PRN

## 2018-02-13 MED ORDER — FENTANYL CITRATE (PF) 100 MCG/2ML IJ SOLN
50.0000 ug | INTRAMUSCULAR | Status: AC | PRN
  Administered 2018-02-13 (×3): 50 ug via INTRAVENOUS

## 2018-02-13 MED ORDER — FENTANYL CITRATE (PF) 100 MCG/2ML IJ SOLN
INTRAMUSCULAR | Status: AC
  Filled 2018-02-13: qty 2

## 2018-02-13 MED ORDER — OXYCODONE-ACETAMINOPHEN 7.5-325 MG PO TABS: 1.0000 | ORAL_TABLET | ORAL | 0 refills | Status: DC | PRN

## 2018-02-13 MED ORDER — CHLORHEXIDINE GLUCONATE 4 % EX LIQD: 60.0000 mL | Freq: Once | CUTANEOUS | Status: DC

## 2018-02-13 MED ORDER — LIDOCAINE HCL (CARDIAC) PF 100 MG/5ML IV SOSY
PREFILLED_SYRINGE | INTRAVENOUS | Status: DC | PRN
  Administered 2018-02-13: 20 mg via INTRAVENOUS

## 2018-02-13 MED ORDER — FENTANYL CITRATE (PF) 100 MCG/2ML IJ SOLN
INTRAMUSCULAR | Status: AC
Start: 2018-02-13 — End: ?
  Filled 2018-02-13: qty 2

## 2018-02-13 MED ORDER — MIDAZOLAM HCL 2 MG/2ML IJ SOLN
1.0000 mg | INTRAMUSCULAR | Status: DC | PRN
  Administered 2018-02-13: 1 mg via INTRAVENOUS

## 2018-02-13 MED ORDER — THROMBIN 20000 UNITS EX SOLR
CUTANEOUS | Status: DC | PRN
  Administered 2018-02-13: 5000 [IU] via TOPICAL

## 2018-02-13 MED ORDER — THROMBIN 5000 UNITS EX SOLR
CUTANEOUS | Status: AC
Start: 2018-02-13 — End: ?
  Filled 2018-02-13: qty 5000

## 2018-02-13 MED ORDER — PROPOFOL 500 MG/50ML IV EMUL
INTRAVENOUS | Status: DC | PRN
  Administered 2018-02-13: 50 ug/kg/min via INTRAVENOUS

## 2018-02-13 MED ORDER — BUPIVACAINE HCL (PF) 0.25 % IJ SOLN
INTRAMUSCULAR | Status: DC | PRN
  Administered 2018-02-13: 10 mL

## 2018-02-13 MED ORDER — SCOPOLAMINE 1 MG/3DAYS TD PT72: 1.0000 | MEDICATED_PATCH | Freq: Once | TRANSDERMAL | Status: DC | PRN

## 2018-02-13 SURGICAL SUPPLY — 52 items
BLADE MINI RND TIP GREEN BEAV (BLADE) ×5 IMPLANT
BLADE SURG 15 STRL LF DISP TIS (BLADE) ×3 IMPLANT
BLADE SURG 15 STRL SS (BLADE) ×5
BNDG CMPR 9X4 STRL LF SNTH (GAUZE/BANDAGES/DRESSINGS) ×3
BNDG COHESIVE 3X5 TAN STRL LF (GAUZE/BANDAGES/DRESSINGS) ×10 IMPLANT
BNDG ESMARK 4X9 LF (GAUZE/BANDAGES/DRESSINGS) ×5 IMPLANT
BNDG GAUZE ELAST 4 BULKY (GAUZE/BANDAGES/DRESSINGS) ×5 IMPLANT
CHLORAPREP W/TINT 26ML (MISCELLANEOUS) ×5 IMPLANT
CORD BIPOLAR FORCEPS 12FT (ELECTRODE) ×5 IMPLANT
COVER BACK TABLE 60X90IN (DRAPES) ×5 IMPLANT
COVER MAYO STAND STRL (DRAPES) ×5 IMPLANT
CUFF TOURN SGL LL 18 NRW (TOURNIQUET CUFF) ×5 IMPLANT
CUFF TOURNIQUET SINGLE 18IN (TOURNIQUET CUFF) ×5 IMPLANT
DECANTER SPIKE VIAL GLASS SM (MISCELLANEOUS) IMPLANT
DRAPE EXTREMITY T 121X128X90 (DRAPE) ×5 IMPLANT
DRAPE SURG 17X23 STRL (DRAPES) ×5 IMPLANT
DRSG PAD ABDOMINAL 8X10 ST (GAUZE/BANDAGES/DRESSINGS) ×5 IMPLANT
GAUZE SPONGE 4X4 12PLY STRL (GAUZE/BANDAGES/DRESSINGS) ×5 IMPLANT
GAUZE SPONGE 4X4 16PLY XRAY LF (GAUZE/BANDAGES/DRESSINGS) IMPLANT
GAUZE XEROFORM 1X8 LF (GAUZE/BANDAGES/DRESSINGS) ×5 IMPLANT
GLOVE BIO SURGEON STRL SZ 6.5 (GLOVE) ×1 IMPLANT
GLOVE BIO SURGEONS STRL SZ 6.5 (GLOVE) ×1
GLOVE BIOGEL PI IND STRL 7.0 (GLOVE) IMPLANT
GLOVE BIOGEL PI IND STRL 8.5 (GLOVE) ×3 IMPLANT
GLOVE BIOGEL PI INDICATOR 7.0 (GLOVE) ×4
GLOVE BIOGEL PI INDICATOR 8.5 (GLOVE) ×2
GLOVE SURG ORTHO 8.0 STRL STRW (GLOVE) ×5 IMPLANT
GOWN STRL REUS W/ TWL LRG LVL3 (GOWN DISPOSABLE) ×3 IMPLANT
GOWN STRL REUS W/TWL LRG LVL3 (GOWN DISPOSABLE) ×5
GOWN STRL REUS W/TWL XL LVL3 (GOWN DISPOSABLE) ×5 IMPLANT
LOOP VESSEL MAXI BLUE (MISCELLANEOUS) ×5 IMPLANT
NDL PRECISIONGLIDE 27X1.5 (NEEDLE) ×3 IMPLANT
NEEDLE PRECISIONGLIDE 27X1.5 (NEEDLE) ×5 IMPLANT
NS IRRIG 1000ML POUR BTL (IV SOLUTION) ×5 IMPLANT
PACK BASIN DAY SURGERY FS (CUSTOM PROCEDURE TRAY) ×5 IMPLANT
PAD CAST 3X4 CTTN HI CHSV (CAST SUPPLIES) ×3 IMPLANT
PAD CAST 4YDX4 CTTN HI CHSV (CAST SUPPLIES) ×3 IMPLANT
PADDING CAST COTTON 3X4 STRL (CAST SUPPLIES) ×5
PADDING CAST COTTON 4X4 STRL (CAST SUPPLIES) ×5
SLEEVE SCD COMPRESS KNEE MED (MISCELLANEOUS) ×5 IMPLANT
SPLINT PLASTER CAST XFAST 3X15 (CAST SUPPLIES) IMPLANT
SPLINT PLASTER XTRA FASTSET 3X (CAST SUPPLIES)
STOCKINETTE 4X48 STRL (DRAPES) ×5 IMPLANT
SUT ETHILON 4 0 PS 2 18 (SUTURE) ×5 IMPLANT
SUT SILK 2 0 PERMA HAND 18 BK (SUTURE) IMPLANT
SUT VIC AB 2-0 SH 27 (SUTURE) ×5
SUT VIC AB 2-0 SH 27XBRD (SUTURE) ×3 IMPLANT
SUT VICRYL 4-0 PS2 18IN ABS (SUTURE) ×5 IMPLANT
SYR BULB 3OZ (MISCELLANEOUS) ×5 IMPLANT
SYR CONTROL 10ML LL (SYRINGE) ×5 IMPLANT
TOWEL GREEN STERILE FF (TOWEL DISPOSABLE) ×10 IMPLANT
UNDERPAD 30X30 (UNDERPADS AND DIAPERS) ×5 IMPLANT

## 2018-02-13 NOTE — Transfer of Care (Signed)
Immediate Anesthesia Transfer of Care Note  Patient: [name]  Procedure(s) Performed: FASCIECTOMY LEFT RING (Left Hand) LEFT CARPAL TUNNEL RELEASE (Left Wrist) DECOMPRESSION LEFT ULNA NERVE (Left Elbow)  Patient Location: PACU  Anesthesia Type:GA combined with regional for post-op pain  Level of Consciousness: awake and patient cooperative  Airway & Oxygen Therapy: Patient Spontanous Breathing and Patient connected to face mask oxygen  Post-op Assessment: Report given to RN and Post -op Vital signs reviewed and stable  Post vital signs: Reviewed and stable  Last Vitals:  Vitals Value Taken Time  BP    Temp    Pulse 85 02/13/2018 10:03 AM  Resp    SpO2 94 % 02/13/2018 10:03 AM  Vitals shown include unvalidated device data.  Last Pain:  Vitals:   02/13/18 0749  TempSrc:   PainSc: 0-No pain      Patients Stated Pain Goal: 3 (58/25/18 9842)  Complications: No apparent anesthesia complications

## 2018-02-13 NOTE — Discharge Instructions (Addendum)

## 2018-02-13 NOTE — Op Note (Signed)
NAME: [name] [facility] MEDICAL RECORD NO: [id number] DATE OF BIRTH: 01-03-1951 FAC[facility] LOCATION: [facility] PHYSICIAN: Wynonia Sours, MD   OPERATIVE REPORT   DATE OF PROCEDURE: 02/13/18    PREOPERATIVE DIAGNOSIS:   Dupuytren's contracture left ring finger carpal tunnel syndrome left hand cubital tunnel syndrome left elbow   POSTOPERATIVE DIAGNOSIS:    Same   PROCEDURE excision palmar fascia with fasciectomy left ring finger carpal tunnel release left hand and decompression ulnar nerve left elbow   SURGEON: Daryll Brod, M.D.   ASSISTANT: none   ANESTHESIA: Supraclavicular block general local infiltration  INTRAVENOUS FLUIDS:  Per anesthesia flow sheet.   ESTIMATED BLOOD LOSS:  Minimal.   COMPLICATIONS:  None.   SPECIMENS:   ExcisionTOURNIQUET TIME:    Total Tourniquet Time Documented: Upper Arm (Left) - 65 minutes Total: Upper Arm (Left) - 65 minutes    DISPOSITION:  Stable to PACU.   INDICATIONS:    Patient is a 67 year old female with a history of Dupuytren's contracture left ring finger.  She has had nerve conductions unrevealing carpal tunnel syndrome cubital tunnel syndrome on her left side.  This is not responded to conservative treatment she is elected undergo release of the median nerve at the wrist decompression possible transposition of the ulnar nerve at the elbow with fasciectomy of the palmar fascia left ring finger.  Pre-peri-and postoperative course been discussed along with risk complications.  She is aware there is no guarantee to the surgery the possibility of infection recurrence injury to arteries nerves tendons complete relief symptoms and dystrophy.  Preoperative area the patient is seen extremity marked by both patient and surgeon antibiotic given  OPERATIVE COURSE:   Procedures apart patient brought to the operating room after supraclavicular block was carried out without difficulty in the preoperative area.  She was prepped in  supine position with the left arm free.  Prep was done with ChloraPrep and a three-minute dry time allowed.  Timeout was taken confirming patient procedure.  The limb was exsanguinated with an Esmarch bandage turn placed on the arm was inflated to 250 mmHg.  The elbow was attended to first.  An oblique incision was made over the medial epicondyle left elbow.  Feeling was still present for the patient and a general anesthetic was given.Procedures apart patient brought to the operating room after supraclavicular block was carried out without difficulty in preoperative area. She was prepped in supine position with the left arm free. The stomach ChloraPrep a three-minute dry time allowed. Timeout was taken confirming patient and procedure. The limb was examined and bandaged + the arms inflated 250 mmHg. The elbow was attended to first. An oblique incision was made over the medial epicondyle left elbow. Feeling was still present with the patient and general anesthetic was given.  Following adequate anesthesia the wound was opened dissected down to Osborne's fascia.  Bleeders were electrocauterized with bipolar.  Posterior branches of the medial antebrachial cutaneous nerve of the forearm were protected.  Osborne's fascia was opened on its posterior aspect.  The ulnar nerve was identified.  The brachial fascia was dissected free from the overlying skin and subcutaneous tissue.  The knee retractor was placed.  The [facility] guide for carpal tunnel release was placed between the nerve and the fascia Following adequate anesthesia the wound was opened dissected down to Osborne's fascia. Electrocautery bipolar. Posterior branches of the medial antebrachial cutaneous or performed and protected. Osborne/was (posterior aspect. The ulnar nerve was identified. The  brachial fascia was dissected free from the overlying skin and subcutaneous tissue. The retractor was placed. Exact by Luvenia Heller for carpal tunnel release was placed between the  nerve fashion and angled ENT scissors were then used to cut the brachial fascia proximally for approximately  were then used to cut the brachial fascia proximally for approximately 8  cm. Attention was then directed distally to the ulnar nerve.thethe fascia the flexor carpi ulnaris was then split. Fascia reports her builders was then split.  The muscle belly was split with blunt dissection.  The knee retractor was placed.  The [facility] guide was placed between the nerve and the deep fascia the flexor carpi ulnaris and this was then cut using the ENT scissors.  The nerve was easily identified.  With flexion it did not subluxate.  The wound was copiously irrigated with saline.  The skin was closed with interrupted 4-0 nylon sutures after repairing Osborne's fascia the posterior flap The with figure-of-eight 4-0 Vicryl along with 4-0 Vicryl and subcutaneous tissue. Muscle belly was split (section. The retractor was placed. Camera Was placed between the nerve and the deep fascia of Scarpa Merocel this was then cut using the HEENT scissors. The nerve was easily identified. With flexion it did not subluxate. The wound was copiously irrigated with saline. The skin was closed with interrupted 4-0 nylon sutures after repairing Osborne's fascia the posterior flap with 4-0 Vicryl along with 4-0 Vicryl and subcutaneous tissue. An incision was made in the left palm longitudinally for carpal tunnel release.  This was extended distally and a Bruner incision not to the ring finger.left palm longitudinally for carpal tunnel release. This was extended distally in a Bruner incision at the ring finger.  The incision was carried down through the the palmar fascia splitting this.  This was released distally from the flexor retinaculum.  The flexor tendon of the ring little finger and palmar arch was identified.  The flexor retinaculum was then released on its ulnar aspect.  A right angle and stool retractor placed between skin and forearm  fascia the fascia was released for approximately 2 cm proximal to the wrist crease under direct vision.  The nerve was explored.  Motor branch entered in the muscle distally.  Area compression of the nerve was apparent. The nerve was explored. Motor branch entered and the muscle distally. Her compression of nerve.  The attention was then directed towards the Dupuytren's contracture.  The palmar fascia was isolated to the ring finger.  This was followed distally with a Bruner incision.  Neurovascular bundles were identified protected throughout the procedure.  The dissection was carried distally to the proximal phalanx excising the entire cord.  This was sent to pathology.  Neurovascular bundles were protected throughout the procedure.  Wounds were copiously irrigated with saline.  Thrombin was then placed at the depth of the wound and a doubled over vessel loop drain was placed.  These were converted to wise.  The incision was then closed with interrupted 4-0 nylon sutures.  A tourniquet was deflated all fingers immediately pink.  A sterile compressive dressing and splint to the wrist with the elbow free was applied.  Patient tolerated the procedure well was taken to the recovery room for observation in satisfactory condition.  She will be discharged home to return [facility] in 1 week on Percocet.  Wynonia Sours, MD Electronically signed, 02/13/18

## 2018-02-13 NOTE — Anesthesia Preprocedure Evaluation (Addendum)
Anesthesia Evaluation  Patient identified by MRN, date of birth, ID band Patient awake    Reviewed: Allergy & Precautions, NPO status , Patient's Chart, lab work & pertinent test results  Airway Mallampati: I  TM Distance: >3 FB Neck ROM: Full    Dental  (+) Dental Advisory Given, Partial Upper, Missing   Pulmonary COPD,  COPD inhaler and oxygen dependent, Current Smoker,    Pulmonary exam normal breath sounds clear to auscultation       Cardiovascular Exercise Tolerance: Good negative cardio ROS Normal cardiovascular exam Rhythm:Regular Rate:Normal     Neuro/Psych PSYCHIATRIC DISORDERS Anxiety negative neurological ROS     GI/Hepatic Neg liver ROS, GERD  Medicated,  Endo/Other  diabetes, Type 2, Oral Hypoglycemic AgentsObesity   Renal/GU negative Renal ROS     Musculoskeletal DUPUYTREN'S CONTRACTUREN CUBITAL TUNNEL LEFT CARPAL TUNNEL LEFT   Abdominal   Peds  Hematology negative hematology ROS (+)   Anesthesia Other Findings Day of surgery medications reviewed with the patient.  H/o breast cancer   Reproductive/Obstetrics                            Anesthesia Physical Anesthesia Plan  ASA: III  Anesthesia Plan: General   Post-op Pain Management:  Regional for Post-op pain   Induction: Intravenous  PONV Risk Score and Plan: 2 and Propofol infusion, Midazolam and Ondansetron  Airway Management Planned: LMA  Additional Equipment:   Intra-op Plan:   Post-operative Plan:   Informed Consent: I have reviewed the patients History and Physical, chart, labs and discussed the procedure including the risks, benefits and alternatives for the proposed anesthesia with the patient or authorized representative who has indicated his/her understanding and acceptance.   Dental advisory given  Plan Discussed with: CRNA  Anesthesia Plan Comments: (GA plus Axillary nerve block.)       Anesthesia Quick Evaluation

## 2018-02-13 NOTE — H&P (Signed)
[name] is an 67 y.o. female.   Chief Complaint: contracture finger and numbness left handHPI: [name] is a 67 year old right-hand-dominant female furred by Dr. 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 He has 2 siblings both with the Dupuytren's cords one has had an amputation of the finger the other has flexion deformities to the hand she states her parents do not had of the problems. She does not know her ancestry. She has no lumps on her feet. These have been present for at least 4 years on her left side greater than 4 years on her right side. There is no history of injury. She complains of a sharp pain with occasional numbness tingling and aching especially to the ring and small fingers bowels both sides. She has not had any treatment for this. She was referred to Dr. Thereasa Parkin for nerve conductions.. This shows a motor delay of 4.2 to the median nerve on her left side. The right side is normal. She shows conduction delay of the ulnar nerve at her elbow She has a history of diabetes no history of thyroid problems arthritis gout. Family history is positive diabetes.      Past Medical History:  Diagnosis Date  . Anxiety   . Cancer [facility])    breast cancer  . COPD (chronic obstructive pulmonary disease) ([place])   . Diabetes mellitus   . Emphysema/COPD ([place])   . History of shingles 06/2011  . Neuromuscular disorder ([place])   . Shortness of breath     Past Surgical History:  Procedure Laterality Date  . ABDOMINAL HYSTERECTOMY  1990  . [place] and 2000  . CATARACT EXTRACTION W/PHACO Right 09/10/2013   Procedure: CATARACT EXTRACTION PHACO AND INTRAOCULAR LENS PLACEMENT (IOC);  Surgeon: Elta Guadeloupe T. Gershon Crane, MD;  Location: [facility];  Service: Ophthalmology;  Laterality: Right;  CDE:12.81  . CHOLECYSTECTOMY  2000  . CYST REMOVAL HAND Right    right wrist  . FRACTURE SURGERY  1994  . HERNIA REPAIR  2007  . MASS EXCISION   08/11/2011   Procedure: EXCISION MASS;  Surgeon: Cammie Sickle., MD;  Location: [facility];  Service: Orthopedics;  Laterality: Right;  Synovectomy of extnsor tendon right long finger  . Rotator cuff Right   . TUBAL LIGATION  1974    Family History  Problem Relation Age of Onset  . Bowel Disease Mother   . Hypertension Mother   . Heart attack Father   . Hypertension Father   . Heart disease Brother   . Aneurysm Brother    Social History:  reports that she has been smoking cigarettes.  She has a 50.00 pack-year smoking history. She has never used smokeless tobacco. She reports that she does not drink alcohol or use drugs.  Allergies:  Allergies  Allergen Reactions  . Vicodin [Hydrocodone-Acetaminophen] Itching    No medications prior to admission.    No results found for this or any previous visit (from the past 48 hour(s)).  No results found.   Pertinent items are noted in HPI.  Height 5' 2.5" (1.588 m), weight 75.8 kg (167 lb).  General appearance: alert, cooperative and appears stated age Head: Normocephalic, without obvious abnormality Neck: no JVD Resp: clear to auscultation bilaterally Cardio: regular rate and rhythm, S1, S2 normal, no murmur, click, rub or gallop GI: normal bowel sounds Extremities: contracture finger and numbness left hand Pulses: 2+ and symmetric Skin: Skin  color, texture, turgor normal. No rashes or lesions Neurologic: Grossly normal Incision/Wound: na  Assessment/Plan Assessment:  1. Contracture of palmar fascia  2. Carpal tunnel syndrome of left wrist  3. Entrapment of left ulnar nerve    Plan: We have discussed fasciectomy of the ring finger along with carpal tunnel cubital tunnel releases. Pre-peri-postoperative course are discussed along with risks and complications. She is aware there is no guarantee to the surgery the possibility of infection recurrence injury to arteries nerves tendons complete relief symptoms  dystrophy. Would like to proceed to have this done. This will be scheduled for an outpatient under regional anesthesia for left carpal tunnel release cubital tunnel decompression possible transposition and fasciectomy to the ring finger.      Wilmina Maxham R 02/13/2018, 5:34 AM

## 2018-02-13 NOTE — Progress Notes (Signed)
Assisted Dr. Turk with left, ultrasound guided, axillary block. Side rails up, monitors on throughout procedure. See vital signs in flow sheet. Tolerated Procedure well. 

## 2018-02-13 NOTE — Anesthesia Procedure Notes (Signed)
Procedure Name: LMA Insertion Date/Time: 02/13/2018 8:45 AM Performed by: Signe Colt, CRNA Pre-anesthesia Checklist: Patient identified, Emergency Drugs available, Suction available and Patient being monitored Patient Re-evaluated:Patient Re-evaluated prior to induction Oxygen Delivery Method: Circle system utilized Preoxygenation: Pre-oxygenation with 100% oxygen Induction Type: IV induction Ventilation: Mask ventilation without difficulty LMA: LMA inserted LMA Size: 4.0 Number of attempts: 1 Airway Equipment and Method: Bite block Placement Confirmation: positive ETCO2 Tube secured with: Tape Dental Injury: Teeth and Oropharynx as per pre-operative assessment

## 2018-02-13 NOTE — Anesthesia Procedure Notes (Signed)
Anesthesia Regional Block: Axillary brachial plexus block   Pre-Anesthetic Checklist: ,, timeout performed, Correct Patient, Correct Site, Correct Laterality, Correct Procedure, Correct Position, site marked, Risks and benefits discussed,  Surgical consent,  Pre-op evaluation,  At surgeon's request and post-op pain management  Laterality: Left  Prep: chloraprep       Needles:  Injection technique: Single-shot  Needle Type: Echogenic Needle     Needle Length: 9cm  Needle Gauge: 21     Additional Needles:   Procedures:,,,, ultrasound used (permanent image in chart),,,,  Narrative:  Start time: 02/13/2018 7:43 AM End time: 02/13/2018 7:49 AM Injection made incrementally with aspirations every 5 mL.  Performed by: Personally  Anesthesiologist: Catalina Gravel, MD  Additional Notes: No pain on injection. No increased resistance to injection. Injection made in 5cc increments.  Good needle visualization.  Patient tolerated procedure well.

## 2018-02-13 NOTE — Brief Op Note (Signed)
02/13/2018  10:01 AM  PATIENT:  [name]  67 y.o. female  PRE-OPERATIVE DIAGNOSIS:  [place] LEFT CARPAL TUNNEL LEFT  POST-OPERATIVE DIAGNOSIS:  DUPUYTREN'S CONTRACTUREN CUBITAL   PROCEDURE:  Procedure(s) with comments: FASCIECTOMY LEFT RING (Left) - REG/AXILLARY BLOCK LEFT CARPAL TUNNEL RELEASE (Left) DECOMPRESSION LEFT ULNA NERVE (Left)  SURGEON:  Surgeon(s) and Role:    Daryll Brod, MD - Primary  PHYSICIAN ASSISTANT:   ASSISTANTS: none   ANESTHESIA:   local, regional and general  EBL: 61ml BLOOD ADMINISTERED:none  DRAINS: Penrose drain in the hand   LOCAL MEDICATIONS USED:  BUPIVICAINE   SPECIMEN:  Excision  DISPOSITION OF SPECIMEN:  PATHOLOGY  COUNTS:  YES  TOURNIQUET:   Total Tourniquet Time Documented: Upper Arm (Left) - 65 minutes Total: Upper Arm (Left) - 65 minutes   DICTATION: .Viviann Spare Dictation  PLAN OF CARE: Discharge to home after PACU  PATIENT DISPOSITION:  PACU - hemodynamically stable.

## 2018-02-14 ENCOUNTER — Encounter (HOSPITAL_BASED_OUTPATIENT_CLINIC_OR_DEPARTMENT_OTHER): Payer: Self-pay | Admitting: Orthopedic Surgery

## 2018-02-14 MED FILL — Thrombin For Soln 5000 Unit: CUTANEOUS | Qty: 5000 | Status: AC

## 2018-02-14 NOTE — Anesthesia Postprocedure Evaluation (Addendum)
Anesthesia Post Note  Patient: [name]  Procedure(s) Performed: FASCIECTOMY LEFT RING (Left Hand) LEFT CARPAL TUNNEL RELEASE (Left Wrist) DECOMPRESSION LEFT ULNA NERVE (Left Elbow)     Patient location during evaluation: PACU Anesthesia Type: General Level of consciousness: awake and alert Pain management: pain level controlled Vital Signs Assessment: post-procedure vital signs reviewed and stable Respiratory status: spontaneous breathing, nonlabored ventilation and respiratory function stable Cardiovascular status: stable and blood pressure returned to baseline Postop Assessment: no apparent nausea or vomiting Anesthetic complications: no    Last Vitals:  Vitals:   02/13/18 1100 02/13/18 1121  BP:  127/65  Pulse:  95  Resp:  18  Temp:  36.5 C  SpO2: 90% 93%    Last Pain:  Vitals:   02/14/18 1102  TempSrc:   PainSc: 0-No pain                 Catalina Gravel

## 2018-02-19 DIAGNOSIS — G5622 Lesion of ulnar nerve, left upper limb: Secondary | ICD-10-CM | POA: Diagnosis not present

## 2018-02-19 DIAGNOSIS — M79642 Pain in left hand: Secondary | ICD-10-CM | POA: Diagnosis not present

## 2018-02-19 DIAGNOSIS — G5602 Carpal tunnel syndrome, left upper limb: Secondary | ICD-10-CM | POA: Diagnosis not present

## 2018-02-19 DIAGNOSIS — M720 Palmar fascial fibromatosis [Dupuytren]: Secondary | ICD-10-CM | POA: Diagnosis not present

## 2018-02-19 DIAGNOSIS — M25642 Stiffness of left hand, not elsewhere classified: Secondary | ICD-10-CM | POA: Diagnosis not present

## 2018-02-25 DIAGNOSIS — J441 Chronic obstructive pulmonary disease with (acute) exacerbation: Secondary | ICD-10-CM | POA: Diagnosis not present

## 2018-03-12 MED ORDER — PROPOFOL 10 MG/ML IV BOLUS
INTRAVENOUS | Status: DC | PRN
  Administered 2018-02-13: 100 mg via INTRAVENOUS

## 2018-03-12 NOTE — Addendum Note (Signed)
Addendum  created 03/12/18 0751 by Willadeen Colantuono, Ernesta Amble, CRNA   Intraprocedure Meds edited

## 2018-03-18 DIAGNOSIS — Z853 Personal history of malignant neoplasm of breast: Secondary | ICD-10-CM | POA: Diagnosis not present

## 2018-03-18 DIAGNOSIS — E119 Type 2 diabetes mellitus without complications: Secondary | ICD-10-CM | POA: Diagnosis not present

## 2018-03-18 DIAGNOSIS — Z7982 Long term (current) use of aspirin: Secondary | ICD-10-CM | POA: Diagnosis not present

## 2018-03-18 DIAGNOSIS — Z720 Tobacco use: Secondary | ICD-10-CM | POA: Diagnosis not present

## 2018-03-18 DIAGNOSIS — R69 Illness, unspecified: Secondary | ICD-10-CM | POA: Diagnosis not present

## 2018-03-18 DIAGNOSIS — J441 Chronic obstructive pulmonary disease with (acute) exacerbation: Secondary | ICD-10-CM | POA: Diagnosis not present

## 2018-03-18 DIAGNOSIS — K219 Gastro-esophageal reflux disease without esophagitis: Secondary | ICD-10-CM | POA: Diagnosis not present

## 2018-03-18 DIAGNOSIS — R0602 Shortness of breath: Secondary | ICD-10-CM | POA: Diagnosis not present

## 2018-03-18 DIAGNOSIS — M1990 Unspecified osteoarthritis, unspecified site: Secondary | ICD-10-CM | POA: Diagnosis not present

## 2018-03-18 DIAGNOSIS — R05 Cough: Secondary | ICD-10-CM | POA: Diagnosis not present

## 2018-03-18 DIAGNOSIS — Z79899 Other long term (current) drug therapy: Secondary | ICD-10-CM | POA: Diagnosis not present

## 2018-03-18 DIAGNOSIS — Z9981 Dependence on supplemental oxygen: Secondary | ICD-10-CM | POA: Diagnosis not present

## 2018-03-26 DIAGNOSIS — R69 Illness, unspecified: Secondary | ICD-10-CM | POA: Diagnosis not present

## 2018-03-28 DIAGNOSIS — J441 Chronic obstructive pulmonary disease with (acute) exacerbation: Secondary | ICD-10-CM | POA: Diagnosis not present

## 2018-03-30 DIAGNOSIS — Z1231 Encounter for screening mammogram for malignant neoplasm of breast: Secondary | ICD-10-CM | POA: Diagnosis not present

## 2018-04-28 DIAGNOSIS — J441 Chronic obstructive pulmonary disease with (acute) exacerbation: Secondary | ICD-10-CM | POA: Diagnosis not present

## 2018-05-19 DIAGNOSIS — Z23 Encounter for immunization: Secondary | ICD-10-CM | POA: Diagnosis not present

## 2018-05-28 DIAGNOSIS — J441 Chronic obstructive pulmonary disease with (acute) exacerbation: Secondary | ICD-10-CM | POA: Diagnosis not present

## 2018-06-22 DIAGNOSIS — R69 Illness, unspecified: Secondary | ICD-10-CM | POA: Diagnosis not present

## 2018-06-28 DIAGNOSIS — J441 Chronic obstructive pulmonary disease with (acute) exacerbation: Secondary | ICD-10-CM | POA: Diagnosis not present

## 2018-07-28 DIAGNOSIS — J441 Chronic obstructive pulmonary disease with (acute) exacerbation: Secondary | ICD-10-CM | POA: Diagnosis not present

## 2018-08-07 DIAGNOSIS — Z961 Presence of intraocular lens: Secondary | ICD-10-CM | POA: Diagnosis not present

## 2018-08-07 DIAGNOSIS — Z7984 Long term (current) use of oral hypoglycemic drugs: Secondary | ICD-10-CM | POA: Diagnosis not present

## 2018-08-07 DIAGNOSIS — H52203 Unspecified astigmatism, bilateral: Secondary | ICD-10-CM | POA: Diagnosis not present

## 2018-08-07 DIAGNOSIS — H5213 Myopia, bilateral: Secondary | ICD-10-CM | POA: Diagnosis not present

## 2018-08-07 DIAGNOSIS — E119 Type 2 diabetes mellitus without complications: Secondary | ICD-10-CM | POA: Diagnosis not present

## 2018-08-07 DIAGNOSIS — H524 Presbyopia: Secondary | ICD-10-CM | POA: Diagnosis not present

## 2018-08-11 DIAGNOSIS — D841 Defects in the complement system: Secondary | ICD-10-CM | POA: Diagnosis not present

## 2018-08-17 DIAGNOSIS — T783XXA Angioneurotic edema, initial encounter: Secondary | ICD-10-CM | POA: Diagnosis not present

## 2018-08-17 DIAGNOSIS — Z6831 Body mass index (BMI) 31.0-31.9, adult: Secondary | ICD-10-CM | POA: Diagnosis not present

## 2018-08-28 DIAGNOSIS — J441 Chronic obstructive pulmonary disease with (acute) exacerbation: Secondary | ICD-10-CM | POA: Diagnosis not present

## 2018-09-05 ENCOUNTER — Encounter: Payer: Self-pay | Admitting: Allergy & Immunology

## 2018-09-05 ENCOUNTER — Ambulatory Visit: Payer: Medicare HMO | Admitting: Allergy & Immunology

## 2018-09-05 VITALS — BP 132/60 | HR 84 | Temp 98.4°F | Resp 18 | Ht 63.25 in | Wt 171.0 lb

## 2018-09-05 DIAGNOSIS — T783XXD Angioneurotic edema, subsequent encounter: Secondary | ICD-10-CM

## 2018-09-05 MED ORDER — CETIRIZINE HCL 10 MG PO TABS: 10.0000 mg | ORAL_TABLET | Freq: Every day | ORAL | 5 refills | Status: DC

## 2018-09-05 MED ORDER — MONTELUKAST SODIUM 10 MG PO TABS: 10.0000 mg | ORAL_TABLET | Freq: Every day | ORAL | 5 refills | Status: AC

## 2018-09-05 MED ORDER — FEXOFENADINE HCL 180 MG PO TABS: 180.0000 mg | ORAL_TABLET | Freq: Every day | ORAL | 5 refills | Status: DC

## 2018-09-05 NOTE — Progress Notes (Signed)
NEW PATIENT  Date of Service/Encounter:  09/05/18  Referring provider: Rory Percy, MD   Assessment:   Angioedema - without urticaria, but very responsive to diphenhydramine  Plan/Recommendations:   1. Angioedema - I am unsure what is causing your tongue swelling.  - I am going to get some labs to rule out serious causes of swelling.  - We will get some labs to look for hereditary angioedema. - We will call you in 1-2 weeks with the results of the testing. - In the meantime, start suppressive dosing of antihistamines:  - Allegra (fexofeandine) 180mg  tablet in the morning  - Zyrtec (cetirizine) 10mg  + Singulair (montelukast) 10mg  at night - Hopefully once we get the testing back, we can come up with a different course of action.  2. Return in about 3 months (around 12/04/2018).   Subjective:   [name] is a 68 y.o. female presenting today for evaluation of  Chief Complaint  Patient presents with  . Angioedema  . Allergic Reaction  . Burning Eyes    [name] has a history of the following: There are no active problems to display for this patient.   History obtained from: chart review and patient.  [name] was referred by Kaylee Percy, MD.     [name] is a 68 y.o. female presenting for an evaluation of tongue and neck swelling. She reports that she has had tongue and airway swelling. This has been going for on 1-2 years. There is no itching whatsoever. She reports that it has "pressure" and denies any itching. Although she does have a sensation of postnasal drip.    Over two years, she has had three ED visits. Otherwise she has been managing them at home. There is no known trigger with any particular food.  She does tolerate all the major food allergens without adverse event.  Overall, the frequency of these episodes is increasing.  It first started on the her right side of her face but now it is bilateral. She has never been given an EpiPen.  She  has never been intubated.  She denies any abdominal pain.  There is no family history of similar swelling episodes.  She does have a sibling who had his gallbladder removed, but otherwise no history of random abdominal pain.  These episodes are not preceded by trauma.  These are never triggered by stinging insects.  She does have some very impressive pictures of tongue swelling as well as swelling of her midline neck.  She does have a history of COPD with albuterol to use as needed.  This is managed by her primary care provider.  She also has nighttime oxygen.  She has never been diagnosed with asthma.  She does endorse a history of rhinitis which is worse in the spring and summer.  She has never been allergy tested.  Otherwise, there is no history of other atopic diseases, including asthma, food allergies, drug allergies, environmental allergies, stinging insect allergies or eczema. There is no significant infectious history. Vaccinations are up to date.    Past Medical History: There are no active problems to display for this patient.   Medication List:  Allergies as of 09/05/2018      Reactions   Vicodin [hydrocodone-acetaminophen] Itching      Medication List       Accurate as of September 05, 2018  2:47 PM. Always use your most recent med list.        albuterol 0.63  MG/3ML nebulizer solution Commonly known as:  ACCUNEB Take 1 ampule by nebulization every 6 (six) hours as needed for wheezing.   aspirin 81 MG tablet Take 81 mg by mouth daily.   calcium carbonate 600 MG Tabs tablet Commonly known as:  OS-CAL Take 1,200 mg by mouth daily.   cholecalciferol 10 MCG (400 UNIT) Tabs tablet Commonly known as:  VITAMIN D3 Take 400 Units by mouth daily.   citalopram 10 MG tablet Commonly known as:  CELEXA Take 10 mg by mouth daily.   fish oil-omega-3 fatty acids 1000 MG capsule Take 2 g by mouth daily.   glipiZIDE 10 MG tablet Commonly known as:  GLUCOTROL Take 10 mg by mouth 2  (two) times daily before a meal.   glucosamine-chondroitin 500-400 MG tablet Take 2 tablets by mouth 2 (two) times daily.   magnesium (amino acid chelate) 133 MG tablet Take 1 tablet by mouth 2 (two) times daily.   omeprazole 20 MG capsule Commonly known as:  PRILOSEC Take 20 mg by mouth daily.   oxyCODONE-acetaminophen 5-325 MG tablet Commonly known as:  PERCOCET Take 1 tablet by mouth every 4 (four) hours as needed for severe pain.   vitamin B-12 1000 MCG tablet Commonly known as:  CYANOCOBALAMIN Take 1,000 mcg by mouth 3 (three) times daily.       Birth History: non-contributory  Developmental History: non-contributory.   Past Surgical History: Past Surgical History:  Procedure Laterality Date  . ABDOMINAL HYSTERECTOMY  1990  . [place] and 2000  . CARPAL TUNNEL RELEASE Left 02/13/2018   Procedure: LEFT CARPAL TUNNEL RELEASE;  Surgeon: Daryll Brod, MD;  Location: [facility];  Service: Orthopedics;  Laterality: Left;  . CATARACT EXTRACTION W/PHACO Right 09/10/2013   Procedure: CATARACT EXTRACTION PHACO AND INTRAOCULAR LENS PLACEMENT (IOC);  Surgeon: Elta Guadeloupe T. Gershon Crane, MD;  Location: [facility];  Service: Ophthalmology;  Laterality: Right;  CDE:12.81  . CHOLECYSTECTOMY  2000  . CYST REMOVAL HAND Right    right wrist  . FASCIECTOMY Left 02/13/2018   Procedure: FASCIECTOMY LEFT RING;  Surgeon: Daryll Brod, MD;  Location: [facility];  Service: Orthopedics;  Laterality: Left;  REG/AXILLARY BLOCK  . FRACTURE SURGERY  1994  . HERNIA REPAIR  2007  . MASS EXCISION  08/11/2011   Procedure: EXCISION MASS;  Surgeon: Cammie Sickle., MD;  Location: [place];  Service: Orthopedics;  Laterality: Right;  Synovectomy of extnsor tendon right long finger  . Rotator cuff Right   . TUBAL LIGATION  1974  . ULNAR NERVE TRANSPOSITION Left 02/13/2018   Procedure: DECOMPRESSION LEFT ULNA NERVE;  Surgeon: Daryll Brod, MD;  Location: [place];  Service: Orthopedics;  Laterality: Left;     Family History: Family History  Problem Relation Age of Onset  . Bowel Disease Mother   . Hypertension Mother   . Psoriasis Mother   . Heart attack Father   . Hypertension Father   . Heart disease Brother   . Aneurysm Brother      Social History: Christee lives at home with her dog.  She is in a house with wood flooring throughout.  There is electric heating and central cooling.  There is one poodle in the home.  She does have dust mite covers on her bedding.  There is no tobacco exposure.    Review of Systems: a 14-point review of systems is pertinent for what is mentioned in HPI.  Otherwise,  all other systems were negative. Constitutional: negative other than that listed in the HPI Eyes: negative other than that listed in the HPI Ears, nose, mouth, throat, and face: negative other than that listed in the HPI Respiratory: negative other than that listed in the HPI Cardiovascular: negative other than that listed in the HPI Gastrointestinal: negative other than that listed in the HPI Genitourinary: negative other than that listed in the HPI Integument: negative other than that listed in the HPI Hematologic: negative other than that listed in the HPI Musculoskeletal: negative other than that listed in the HPI Neurological: negative other than that listed in the HPI Allergy/Immunologic: negative other than that listed in the HPI    Objective:   Blood pressure 132/60, pulse 84, temperature 98.4 F (36.9 C), temperature source Oral, resp. rate 18, height 5' 3.25" (1.607 m), weight 171 lb (77.6 kg), SpO2 96 %. Body mass index is 30.05 kg/m.   Physical Exam:  General: Alert, interactive, in no acute distress. Very pleasant and talkative.  Eyes: No conjunctival injection bilaterally, no discharge on the right, no discharge on the left and no Horner-Trantas dots present. PERRL bilaterally. EOMI without pain. No  photophobia.  Ears: Right TM pearly gray with normal light reflex, Left TM pearly gray with normal light reflex, Right TM intact without perforation and Left TM intact without perforation.  Nose/Throat: External nose within normal limits and septum midline. Turbinates edematous without discharge. Posterior oropharynx erythematous without cobblestoning in the posterior oropharynx. Tonsils 2+ without exudates.  Tongue without thrush. Neck: Supple without thyromegaly. Trachea midline. Adenopathy: no enlarged lymph nodes appreciated in the anterior cervical, occipital, axillary, epitrochlear, inguinal, or popliteal regions. Lungs: Clear to auscultation without wheezing, rhonchi or rales. No increased work of breathing. CV: Normal S1/S2. No murmurs. Capillary refill <2 seconds.  Abdomen: Nondistended, nontender. No guarding or rebound tenderness. Bowel sounds present in all fields and hypoactive  Skin: Warm and dry, without lesions or rashes. Extremities:  No clubbing, cyanosis or edema. Neuro:   Grossly intact. No focal deficits appreciated. Responsive to questions.  Diagnostic studies: none       Salvatore Marvel, MD [facility]

## 2018-09-05 NOTE — Patient Instructions (Addendum)
1. Angioedema - I am unsure what is causing your tongue swelling.  - I am going to get some labs to rule out serious causes of swelling.  - We will get some labs to look for hereditary angioedema. - We will call you in 1-2 weeks with the results of the testing. - In the meantime, start suppressive dosing of antihistamines:  - Allegra (fexofeandine) 180mg  tablet in the morning  - Zyrtec (cetirizine) 10mg  + Singulair (montelukast) 10mg  at night - Hopefully once we get the testing back, we can come up with a different course of action.  2. Return in about 3 months (around 12/04/2018).   Please inform us of any Emergency Department visits, hospitalizations, or changes in symptoms. Call us before going to the ED for breathing or allergy symptoms since we might be able to fit you in for a sick visit. Feel free to contact us anytime with any questions, problems, or concerns.  It was a pleasure to meet you today!  Websites that have reliable patient information: 1. American Academy of Asthma, Allergy, and Immunology: www.aaaai.org 2. Food Allergy Research and Education (FARE): foodallergy.org 3. Mothers of Asthmatics: http://www.asthmacommunitynetwork.org 4. American College of Allergy, Asthma, and Immunology: MonthlyElectricBill.co.uk   Make sure you are registered to vote! If you have moved or changed any of your contact information, you will need to get this updated before voting!    Voter ID laws are POSSIBLY going into effect for the General Election in November 2020! Be prepared! Check out http://levine.[email or web address] for more details.

## 2018-09-11 LAB — IGE+ALLERGENS ZONE 2(30)
Bahia Grass IgE: <0.1 kU/L
Bermuda Grass IgE: <0.1 kU/L
Cedar, Mountain IgE: <0.1 kU/L
Cockroach, American IgE: <0.1 kU/L
D Farinae IgE: <0.1 kU/L
D Pteronyssinus IgE: <0.1 kU/L
Elm, American IgE: <0.1 kU/L
Hickory, White IgE: <0.1 kU/L
IgE (Immunoglobulin E), Serum: 78 IU/mL (ref 6–495)
Maple/Box Elder IgE: <0.1 kU/L
Nettle IgE: <0.1 kU/L
Oak, White IgE: <0.1 kU/L
Penicillium Chrysogen IgE: <0.1 kU/L
Pigweed, Rough IgE: <0.1 kU/L
Plantain, English IgE: <0.1 kU/L
Sheep Sorrel IgE Qn: <0.1 kU/L
Stemphylium Herbarum IgE: <0.1 kU/L

## 2018-09-11 LAB — ANA: ANA: NEGATIVE

## 2018-09-11 LAB — ALPHA-GAL PANEL
ALPHA GAL IGE: 0.16 kU/L — AB (ref <0.10)
Class Interpretation: 0
Class Interpretation: 0
LAMB CLASS INTERPRETATION: 0
Lamb/Mutton (Ovis spp) IgE: <0.1 kU/L (ref <0.35)

## 2018-09-11 LAB — TRYPTASE: Tryptase: 4.3 ug/L (ref 2.2–13.2)

## 2018-09-11 LAB — SEDIMENTATION RATE: Sed Rate: 19 mm/hr (ref 0–40)

## 2018-09-11 LAB — C-REACTIVE PROTEIN: CRP: 9 mg/L (ref 0–10)

## 2018-09-12 LAB — C4 COMPLEMENT: Complement C4, Serum: 24 mg/dL (ref 14–44)

## 2018-09-12 LAB — COMPLEMENT COMPONENT C1Q: COMPLEMENT C1Q: 13.1 mg/dL (ref 10.3–20.5)

## 2018-09-12 LAB — C1 ESTERASE INHIBITOR, FUNCTIONAL: C1INH Functional/C1INH Total MFr SerPl: 90 %mean normal

## 2018-09-12 LAB — C1 ESTERASE INHIBITOR: C1INH SerPl-mCnc: 27 mg/dL (ref 21–39)

## 2018-09-17 DIAGNOSIS — R69 Illness, unspecified: Secondary | ICD-10-CM | POA: Diagnosis not present

## 2018-09-28 DIAGNOSIS — J441 Chronic obstructive pulmonary disease with (acute) exacerbation: Secondary | ICD-10-CM | POA: Diagnosis not present

## 2018-10-27 DIAGNOSIS — J441 Chronic obstructive pulmonary disease with (acute) exacerbation: Secondary | ICD-10-CM | POA: Diagnosis not present

## 2018-11-27 DIAGNOSIS — J441 Chronic obstructive pulmonary disease with (acute) exacerbation: Secondary | ICD-10-CM | POA: Diagnosis not present

## 2018-12-05 ENCOUNTER — Ambulatory Visit: Payer: Medicare HMO | Admitting: Allergy & Immunology

## 2018-12-16 DIAGNOSIS — R69 Illness, unspecified: Secondary | ICD-10-CM | POA: Diagnosis not present

## 2018-12-27 DIAGNOSIS — J441 Chronic obstructive pulmonary disease with (acute) exacerbation: Secondary | ICD-10-CM | POA: Diagnosis not present

## 2019-01-27 DIAGNOSIS — J441 Chronic obstructive pulmonary disease with (acute) exacerbation: Secondary | ICD-10-CM | POA: Diagnosis not present

## 2019-02-26 DIAGNOSIS — J441 Chronic obstructive pulmonary disease with (acute) exacerbation: Secondary | ICD-10-CM | POA: Diagnosis not present

## 2019-03-01 DIAGNOSIS — E1165 Type 2 diabetes mellitus with hyperglycemia: Secondary | ICD-10-CM | POA: Diagnosis not present

## 2019-03-01 DIAGNOSIS — J441 Chronic obstructive pulmonary disease with (acute) exacerbation: Secondary | ICD-10-CM | POA: Diagnosis not present

## 2019-03-16 DIAGNOSIS — R69 Illness, unspecified: Secondary | ICD-10-CM | POA: Diagnosis not present

## 2019-03-29 DIAGNOSIS — J441 Chronic obstructive pulmonary disease with (acute) exacerbation: Secondary | ICD-10-CM | POA: Diagnosis not present

## 2019-04-01 DIAGNOSIS — E1165 Type 2 diabetes mellitus with hyperglycemia: Secondary | ICD-10-CM | POA: Diagnosis not present

## 2019-04-01 DIAGNOSIS — R69 Illness, unspecified: Secondary | ICD-10-CM | POA: Diagnosis not present

## 2019-04-15 DIAGNOSIS — Z1231 Encounter for screening mammogram for malignant neoplasm of breast: Secondary | ICD-10-CM | POA: Diagnosis not present

## 2019-04-18 DIAGNOSIS — Z23 Encounter for immunization: Secondary | ICD-10-CM | POA: Diagnosis not present

## 2019-04-29 DIAGNOSIS — J441 Chronic obstructive pulmonary disease with (acute) exacerbation: Secondary | ICD-10-CM | POA: Diagnosis not present

## 2019-05-01 DIAGNOSIS — E1140 Type 2 diabetes mellitus with diabetic neuropathy, unspecified: Secondary | ICD-10-CM | POA: Diagnosis not present

## 2019-05-01 DIAGNOSIS — J441 Chronic obstructive pulmonary disease with (acute) exacerbation: Secondary | ICD-10-CM | POA: Diagnosis not present

## 2019-05-01 DIAGNOSIS — E1165 Type 2 diabetes mellitus with hyperglycemia: Secondary | ICD-10-CM | POA: Diagnosis not present

## 2019-05-12 DIAGNOSIS — Z7984 Long term (current) use of oral hypoglycemic drugs: Secondary | ICD-10-CM | POA: Diagnosis not present

## 2019-05-12 DIAGNOSIS — G8929 Other chronic pain: Secondary | ICD-10-CM | POA: Diagnosis not present

## 2019-05-12 DIAGNOSIS — K219 Gastro-esophageal reflux disease without esophagitis: Secondary | ICD-10-CM | POA: Diagnosis not present

## 2019-05-12 DIAGNOSIS — H547 Unspecified visual loss: Secondary | ICD-10-CM | POA: Diagnosis not present

## 2019-05-12 DIAGNOSIS — M1990 Unspecified osteoarthritis, unspecified site: Secondary | ICD-10-CM | POA: Diagnosis not present

## 2019-05-12 DIAGNOSIS — J449 Chronic obstructive pulmonary disease, unspecified: Secondary | ICD-10-CM | POA: Diagnosis not present

## 2019-05-12 DIAGNOSIS — E1165 Type 2 diabetes mellitus with hyperglycemia: Secondary | ICD-10-CM | POA: Diagnosis not present

## 2019-05-12 DIAGNOSIS — G4700 Insomnia, unspecified: Secondary | ICD-10-CM | POA: Diagnosis not present

## 2019-05-12 DIAGNOSIS — Z7982 Long term (current) use of aspirin: Secondary | ICD-10-CM | POA: Diagnosis not present

## 2019-05-12 DIAGNOSIS — J302 Other seasonal allergic rhinitis: Secondary | ICD-10-CM | POA: Diagnosis not present

## 2019-05-29 DIAGNOSIS — J441 Chronic obstructive pulmonary disease with (acute) exacerbation: Secondary | ICD-10-CM | POA: Diagnosis not present

## 2019-05-31 DIAGNOSIS — E1165 Type 2 diabetes mellitus with hyperglycemia: Secondary | ICD-10-CM | POA: Diagnosis not present

## 2019-05-31 DIAGNOSIS — E1140 Type 2 diabetes mellitus with diabetic neuropathy, unspecified: Secondary | ICD-10-CM | POA: Diagnosis not present

## 2019-05-31 DIAGNOSIS — K219 Gastro-esophageal reflux disease without esophagitis: Secondary | ICD-10-CM | POA: Diagnosis not present

## 2019-05-31 DIAGNOSIS — Z13220 Encounter for screening for lipoid disorders: Secondary | ICD-10-CM | POA: Diagnosis not present

## 2019-05-31 DIAGNOSIS — M810 Age-related osteoporosis without current pathological fracture: Secondary | ICD-10-CM | POA: Diagnosis not present

## 2019-05-31 DIAGNOSIS — J441 Chronic obstructive pulmonary disease with (acute) exacerbation: Secondary | ICD-10-CM | POA: Diagnosis not present

## 2019-06-03 DIAGNOSIS — M810 Age-related osteoporosis without current pathological fracture: Secondary | ICD-10-CM | POA: Diagnosis not present

## 2019-06-03 DIAGNOSIS — E1140 Type 2 diabetes mellitus with diabetic neuropathy, unspecified: Secondary | ICD-10-CM | POA: Diagnosis not present

## 2019-06-03 DIAGNOSIS — Z0001 Encounter for general adult medical examination with abnormal findings: Secondary | ICD-10-CM | POA: Diagnosis not present

## 2019-06-13 DIAGNOSIS — R69 Illness, unspecified: Secondary | ICD-10-CM | POA: Diagnosis not present

## 2019-06-13 DIAGNOSIS — B084 Enteroviral vesicular stomatitis with exanthem: Secondary | ICD-10-CM | POA: Diagnosis not present

## 2019-06-21 DIAGNOSIS — B084 Enteroviral vesicular stomatitis with exanthem: Secondary | ICD-10-CM | POA: Diagnosis not present

## 2019-06-29 DIAGNOSIS — J441 Chronic obstructive pulmonary disease with (acute) exacerbation: Secondary | ICD-10-CM | POA: Diagnosis not present

## 2019-07-01 DIAGNOSIS — E1140 Type 2 diabetes mellitus with diabetic neuropathy, unspecified: Secondary | ICD-10-CM | POA: Diagnosis not present

## 2019-07-01 DIAGNOSIS — J441 Chronic obstructive pulmonary disease with (acute) exacerbation: Secondary | ICD-10-CM | POA: Diagnosis not present

## 2019-07-29 DIAGNOSIS — J441 Chronic obstructive pulmonary disease with (acute) exacerbation: Secondary | ICD-10-CM | POA: Diagnosis not present

## 2019-08-01 DIAGNOSIS — J449 Chronic obstructive pulmonary disease, unspecified: Secondary | ICD-10-CM | POA: Diagnosis not present

## 2019-08-01 DIAGNOSIS — E1140 Type 2 diabetes mellitus with diabetic neuropathy, unspecified: Secondary | ICD-10-CM | POA: Diagnosis not present

## 2019-08-15 DIAGNOSIS — L309 Dermatitis, unspecified: Secondary | ICD-10-CM | POA: Diagnosis not present

## 2019-08-15 DIAGNOSIS — Z0001 Encounter for general adult medical examination with abnormal findings: Secondary | ICD-10-CM | POA: Diagnosis not present

## 2019-08-15 DIAGNOSIS — J441 Chronic obstructive pulmonary disease with (acute) exacerbation: Secondary | ICD-10-CM | POA: Diagnosis not present

## 2019-08-15 DIAGNOSIS — Z6831 Body mass index (BMI) 31.0-31.9, adult: Secondary | ICD-10-CM | POA: Diagnosis not present

## 2019-08-15 DIAGNOSIS — E1140 Type 2 diabetes mellitus with diabetic neuropathy, unspecified: Secondary | ICD-10-CM | POA: Diagnosis not present

## 2019-08-29 DIAGNOSIS — J441 Chronic obstructive pulmonary disease with (acute) exacerbation: Secondary | ICD-10-CM | POA: Diagnosis not present

## 2019-08-30 DIAGNOSIS — E1165 Type 2 diabetes mellitus with hyperglycemia: Secondary | ICD-10-CM | POA: Diagnosis not present

## 2019-08-30 DIAGNOSIS — J441 Chronic obstructive pulmonary disease with (acute) exacerbation: Secondary | ICD-10-CM | POA: Diagnosis not present

## 2019-09-09 DIAGNOSIS — R69 Illness, unspecified: Secondary | ICD-10-CM | POA: Diagnosis not present

## 2019-09-27 DIAGNOSIS — E7849 Other hyperlipidemia: Secondary | ICD-10-CM | POA: Diagnosis not present

## 2019-09-27 DIAGNOSIS — I10 Essential (primary) hypertension: Secondary | ICD-10-CM | POA: Diagnosis not present

## 2019-09-29 DIAGNOSIS — J441 Chronic obstructive pulmonary disease with (acute) exacerbation: Secondary | ICD-10-CM | POA: Diagnosis not present

## 2019-10-27 DIAGNOSIS — J441 Chronic obstructive pulmonary disease with (acute) exacerbation: Secondary | ICD-10-CM | POA: Diagnosis not present

## 2019-10-30 DIAGNOSIS — E7849 Other hyperlipidemia: Secondary | ICD-10-CM | POA: Diagnosis not present

## 2019-10-30 DIAGNOSIS — I10 Essential (primary) hypertension: Secondary | ICD-10-CM | POA: Diagnosis not present

## 2019-11-27 DIAGNOSIS — J441 Chronic obstructive pulmonary disease with (acute) exacerbation: Secondary | ICD-10-CM | POA: Diagnosis not present

## 2019-11-29 DIAGNOSIS — K219 Gastro-esophageal reflux disease without esophagitis: Secondary | ICD-10-CM | POA: Diagnosis not present

## 2019-11-29 DIAGNOSIS — J441 Chronic obstructive pulmonary disease with (acute) exacerbation: Secondary | ICD-10-CM | POA: Diagnosis not present

## 2019-11-29 DIAGNOSIS — B084 Enteroviral vesicular stomatitis with exanthem: Secondary | ICD-10-CM | POA: Diagnosis not present

## 2019-12-05 DIAGNOSIS — H524 Presbyopia: Secondary | ICD-10-CM | POA: Diagnosis not present

## 2019-12-05 DIAGNOSIS — Z961 Presence of intraocular lens: Secondary | ICD-10-CM | POA: Diagnosis not present

## 2019-12-05 DIAGNOSIS — E119 Type 2 diabetes mellitus without complications: Secondary | ICD-10-CM | POA: Diagnosis not present

## 2019-12-05 DIAGNOSIS — Z0100 Encounter for examination of eyes and vision without abnormal findings: Secondary | ICD-10-CM | POA: Diagnosis not present

## 2019-12-07 DIAGNOSIS — R69 Illness, unspecified: Secondary | ICD-10-CM | POA: Diagnosis not present

## 2019-12-09 DIAGNOSIS — J019 Acute sinusitis, unspecified: Secondary | ICD-10-CM | POA: Diagnosis not present

## 2019-12-27 DIAGNOSIS — J441 Chronic obstructive pulmonary disease with (acute) exacerbation: Secondary | ICD-10-CM | POA: Diagnosis not present

## 2019-12-30 DIAGNOSIS — D0592 Unspecified type of carcinoma in situ of left breast: Secondary | ICD-10-CM | POA: Diagnosis not present

## 2019-12-30 DIAGNOSIS — Z720 Tobacco use: Secondary | ICD-10-CM | POA: Diagnosis not present

## 2019-12-30 DIAGNOSIS — J441 Chronic obstructive pulmonary disease with (acute) exacerbation: Secondary | ICD-10-CM | POA: Diagnosis not present

## 2019-12-30 DIAGNOSIS — Z7984 Long term (current) use of oral hypoglycemic drugs: Secondary | ICD-10-CM | POA: Diagnosis not present

## 2019-12-30 DIAGNOSIS — E1165 Type 2 diabetes mellitus with hyperglycemia: Secondary | ICD-10-CM | POA: Diagnosis not present

## 2020-01-10 DIAGNOSIS — J439 Emphysema, unspecified: Secondary | ICD-10-CM | POA: Diagnosis not present

## 2020-01-10 DIAGNOSIS — Z6831 Body mass index (BMI) 31.0-31.9, adult: Secondary | ICD-10-CM | POA: Diagnosis not present

## 2020-01-10 DIAGNOSIS — R32 Unspecified urinary incontinence: Secondary | ICD-10-CM | POA: Diagnosis not present

## 2020-01-10 DIAGNOSIS — R030 Elevated blood-pressure reading, without diagnosis of hypertension: Secondary | ICD-10-CM | POA: Diagnosis not present

## 2020-01-10 DIAGNOSIS — R69 Illness, unspecified: Secondary | ICD-10-CM | POA: Diagnosis not present

## 2020-01-10 DIAGNOSIS — Z008 Encounter for other general examination: Secondary | ICD-10-CM | POA: Diagnosis not present

## 2020-01-10 DIAGNOSIS — K219 Gastro-esophageal reflux disease without esophagitis: Secondary | ICD-10-CM | POA: Diagnosis not present

## 2020-01-10 DIAGNOSIS — E669 Obesity, unspecified: Secondary | ICD-10-CM | POA: Diagnosis not present

## 2020-01-10 DIAGNOSIS — E785 Hyperlipidemia, unspecified: Secondary | ICD-10-CM | POA: Diagnosis not present

## 2020-01-10 DIAGNOSIS — E1165 Type 2 diabetes mellitus with hyperglycemia: Secondary | ICD-10-CM | POA: Diagnosis not present

## 2020-01-10 DIAGNOSIS — Z7982 Long term (current) use of aspirin: Secondary | ICD-10-CM | POA: Diagnosis not present

## 2020-01-27 DIAGNOSIS — J441 Chronic obstructive pulmonary disease with (acute) exacerbation: Secondary | ICD-10-CM | POA: Diagnosis not present

## 2020-01-29 DIAGNOSIS — D0592 Unspecified type of carcinoma in situ of left breast: Secondary | ICD-10-CM | POA: Diagnosis not present

## 2020-01-29 DIAGNOSIS — E1165 Type 2 diabetes mellitus with hyperglycemia: Secondary | ICD-10-CM | POA: Diagnosis not present

## 2020-01-29 DIAGNOSIS — Z7984 Long term (current) use of oral hypoglycemic drugs: Secondary | ICD-10-CM | POA: Diagnosis not present

## 2020-01-29 DIAGNOSIS — J441 Chronic obstructive pulmonary disease with (acute) exacerbation: Secondary | ICD-10-CM | POA: Diagnosis not present

## 2020-01-29 DIAGNOSIS — Z720 Tobacco use: Secondary | ICD-10-CM | POA: Diagnosis not present

## 2020-02-26 DIAGNOSIS — J441 Chronic obstructive pulmonary disease with (acute) exacerbation: Secondary | ICD-10-CM | POA: Diagnosis not present

## 2020-03-05 DIAGNOSIS — R69 Illness, unspecified: Secondary | ICD-10-CM | POA: Diagnosis not present

## 2020-03-23 DIAGNOSIS — E1140 Type 2 diabetes mellitus with diabetic neuropathy, unspecified: Secondary | ICD-10-CM | POA: Diagnosis not present

## 2020-03-23 DIAGNOSIS — E1165 Type 2 diabetes mellitus with hyperglycemia: Secondary | ICD-10-CM | POA: Diagnosis not present

## 2020-03-26 DIAGNOSIS — R221 Localized swelling, mass and lump, neck: Secondary | ICD-10-CM | POA: Diagnosis not present

## 2020-03-26 DIAGNOSIS — J029 Acute pharyngitis, unspecified: Secondary | ICD-10-CM | POA: Diagnosis not present

## 2020-03-31 DIAGNOSIS — Z7984 Long term (current) use of oral hypoglycemic drugs: Secondary | ICD-10-CM | POA: Diagnosis not present

## 2020-03-31 DIAGNOSIS — J441 Chronic obstructive pulmonary disease with (acute) exacerbation: Secondary | ICD-10-CM | POA: Diagnosis not present

## 2020-03-31 DIAGNOSIS — Z720 Tobacco use: Secondary | ICD-10-CM | POA: Diagnosis not present

## 2020-03-31 DIAGNOSIS — E1165 Type 2 diabetes mellitus with hyperglycemia: Secondary | ICD-10-CM | POA: Diagnosis not present

## 2020-04-03 DIAGNOSIS — M542 Cervicalgia: Secondary | ICD-10-CM | POA: Diagnosis not present

## 2020-04-03 DIAGNOSIS — R221 Localized swelling, mass and lump, neck: Secondary | ICD-10-CM | POA: Diagnosis not present

## 2020-04-09 DIAGNOSIS — K1370 Unspecified lesions of oral mucosa: Secondary | ICD-10-CM | POA: Diagnosis not present

## 2020-04-09 DIAGNOSIS — J441 Chronic obstructive pulmonary disease with (acute) exacerbation: Secondary | ICD-10-CM | POA: Diagnosis not present

## 2020-04-09 DIAGNOSIS — Z20828 Contact with and (suspected) exposure to other viral communicable diseases: Secondary | ICD-10-CM | POA: Diagnosis not present

## 2020-04-14 DIAGNOSIS — K115 Sialolithiasis: Secondary | ICD-10-CM | POA: Diagnosis not present

## 2020-04-15 DIAGNOSIS — R69 Illness, unspecified: Secondary | ICD-10-CM | POA: Diagnosis not present

## 2020-04-16 DIAGNOSIS — E785 Hyperlipidemia, unspecified: Secondary | ICD-10-CM | POA: Diagnosis not present

## 2020-04-16 DIAGNOSIS — Z6830 Body mass index (BMI) 30.0-30.9, adult: Secondary | ICD-10-CM | POA: Diagnosis not present

## 2020-04-16 DIAGNOSIS — Z1231 Encounter for screening mammogram for malignant neoplasm of breast: Secondary | ICD-10-CM | POA: Diagnosis not present

## 2020-04-16 DIAGNOSIS — E1169 Type 2 diabetes mellitus with other specified complication: Secondary | ICD-10-CM | POA: Diagnosis not present

## 2020-04-16 DIAGNOSIS — J441 Chronic obstructive pulmonary disease with (acute) exacerbation: Secondary | ICD-10-CM | POA: Diagnosis not present

## 2020-04-16 DIAGNOSIS — K1370 Unspecified lesions of oral mucosa: Secondary | ICD-10-CM | POA: Diagnosis not present

## 2020-04-16 DIAGNOSIS — Z20828 Contact with and (suspected) exposure to other viral communicable diseases: Secondary | ICD-10-CM | POA: Diagnosis not present

## 2020-04-30 DIAGNOSIS — J441 Chronic obstructive pulmonary disease with (acute) exacerbation: Secondary | ICD-10-CM | POA: Diagnosis not present

## 2020-04-30 DIAGNOSIS — Z7984 Long term (current) use of oral hypoglycemic drugs: Secondary | ICD-10-CM | POA: Diagnosis not present

## 2020-04-30 DIAGNOSIS — E1165 Type 2 diabetes mellitus with hyperglycemia: Secondary | ICD-10-CM | POA: Diagnosis not present

## 2020-04-30 DIAGNOSIS — Z720 Tobacco use: Secondary | ICD-10-CM | POA: Diagnosis not present

## 2020-05-14 DIAGNOSIS — Z23 Encounter for immunization: Secondary | ICD-10-CM | POA: Diagnosis not present

## 2020-05-14 DIAGNOSIS — Z6829 Body mass index (BMI) 29.0-29.9, adult: Secondary | ICD-10-CM | POA: Diagnosis not present

## 2020-05-14 DIAGNOSIS — E559 Vitamin D deficiency, unspecified: Secondary | ICD-10-CM | POA: Diagnosis not present

## 2020-05-14 DIAGNOSIS — K1370 Unspecified lesions of oral mucosa: Secondary | ICD-10-CM | POA: Diagnosis not present

## 2020-05-14 DIAGNOSIS — Z20822 Contact with and (suspected) exposure to covid-19: Secondary | ICD-10-CM | POA: Diagnosis not present

## 2020-05-14 DIAGNOSIS — E785 Hyperlipidemia, unspecified: Secondary | ICD-10-CM | POA: Diagnosis not present

## 2020-05-14 DIAGNOSIS — E1169 Type 2 diabetes mellitus with other specified complication: Secondary | ICD-10-CM | POA: Diagnosis not present

## 2020-05-14 DIAGNOSIS — J441 Chronic obstructive pulmonary disease with (acute) exacerbation: Secondary | ICD-10-CM | POA: Diagnosis not present

## 2020-05-14 DIAGNOSIS — Z20828 Contact with and (suspected) exposure to other viral communicable diseases: Secondary | ICD-10-CM | POA: Diagnosis not present

## 2020-05-21 DIAGNOSIS — E785 Hyperlipidemia, unspecified: Secondary | ICD-10-CM | POA: Diagnosis not present

## 2020-05-21 DIAGNOSIS — Z20822 Contact with and (suspected) exposure to covid-19: Secondary | ICD-10-CM | POA: Diagnosis not present

## 2020-05-21 DIAGNOSIS — D696 Thrombocytopenia, unspecified: Secondary | ICD-10-CM | POA: Diagnosis not present

## 2020-05-21 DIAGNOSIS — J441 Chronic obstructive pulmonary disease with (acute) exacerbation: Secondary | ICD-10-CM | POA: Diagnosis not present

## 2020-05-21 DIAGNOSIS — K1370 Unspecified lesions of oral mucosa: Secondary | ICD-10-CM | POA: Diagnosis not present

## 2020-05-21 DIAGNOSIS — Z6829 Body mass index (BMI) 29.0-29.9, adult: Secondary | ICD-10-CM | POA: Diagnosis not present

## 2020-05-21 DIAGNOSIS — E1169 Type 2 diabetes mellitus with other specified complication: Secondary | ICD-10-CM | POA: Diagnosis not present

## 2020-05-21 DIAGNOSIS — R69 Illness, unspecified: Secondary | ICD-10-CM | POA: Diagnosis not present

## 2020-05-30 DIAGNOSIS — J441 Chronic obstructive pulmonary disease with (acute) exacerbation: Secondary | ICD-10-CM | POA: Diagnosis not present

## 2020-05-30 DIAGNOSIS — E1165 Type 2 diabetes mellitus with hyperglycemia: Secondary | ICD-10-CM | POA: Diagnosis not present

## 2020-05-30 DIAGNOSIS — Z720 Tobacco use: Secondary | ICD-10-CM | POA: Diagnosis not present

## 2020-05-30 DIAGNOSIS — Z7984 Long term (current) use of oral hypoglycemic drugs: Secondary | ICD-10-CM | POA: Diagnosis not present

## 2020-06-03 DIAGNOSIS — Z0189 Encounter for other specified special examinations: Secondary | ICD-10-CM | POA: Diagnosis not present

## 2020-06-03 DIAGNOSIS — E1140 Type 2 diabetes mellitus with diabetic neuropathy, unspecified: Secondary | ICD-10-CM | POA: Diagnosis not present

## 2020-06-03 DIAGNOSIS — R69 Illness, unspecified: Secondary | ICD-10-CM | POA: Diagnosis not present

## 2020-06-03 DIAGNOSIS — J431 Panlobular emphysema: Secondary | ICD-10-CM | POA: Diagnosis not present

## 2020-06-03 DIAGNOSIS — Z720 Tobacco use: Secondary | ICD-10-CM | POA: Diagnosis not present

## 2020-06-03 DIAGNOSIS — D696 Thrombocytopenia, unspecified: Secondary | ICD-10-CM | POA: Diagnosis not present

## 2020-06-12 DIAGNOSIS — D696 Thrombocytopenia, unspecified: Secondary | ICD-10-CM | POA: Diagnosis not present

## 2020-06-12 DIAGNOSIS — Z9049 Acquired absence of other specified parts of digestive tract: Secondary | ICD-10-CM | POA: Diagnosis not present

## 2020-06-12 DIAGNOSIS — R161 Splenomegaly, not elsewhere classified: Secondary | ICD-10-CM | POA: Diagnosis not present

## 2020-06-12 DIAGNOSIS — K760 Fatty (change of) liver, not elsewhere classified: Secondary | ICD-10-CM | POA: Diagnosis not present

## 2020-06-15 DIAGNOSIS — K115 Sialolithiasis: Secondary | ICD-10-CM | POA: Diagnosis not present

## 2020-06-19 DIAGNOSIS — R69 Illness, unspecified: Secondary | ICD-10-CM | POA: Diagnosis not present

## 2020-06-23 DIAGNOSIS — D7589 Other specified diseases of blood and blood-forming organs: Secondary | ICD-10-CM | POA: Diagnosis not present

## 2020-06-23 DIAGNOSIS — R0602 Shortness of breath: Secondary | ICD-10-CM | POA: Diagnosis not present

## 2020-06-23 DIAGNOSIS — Z79899 Other long term (current) drug therapy: Secondary | ICD-10-CM | POA: Diagnosis not present

## 2020-06-23 DIAGNOSIS — Z720 Tobacco use: Secondary | ICD-10-CM | POA: Diagnosis not present

## 2020-06-23 DIAGNOSIS — R161 Splenomegaly, not elsewhere classified: Secondary | ICD-10-CM | POA: Diagnosis not present

## 2020-06-23 DIAGNOSIS — D696 Thrombocytopenia, unspecified: Secondary | ICD-10-CM | POA: Diagnosis not present

## 2020-06-23 DIAGNOSIS — Z8639 Personal history of other endocrine, nutritional and metabolic disease: Secondary | ICD-10-CM | POA: Diagnosis not present

## 2020-06-30 DIAGNOSIS — Z7984 Long term (current) use of oral hypoglycemic drugs: Secondary | ICD-10-CM | POA: Diagnosis not present

## 2020-06-30 DIAGNOSIS — E1165 Type 2 diabetes mellitus with hyperglycemia: Secondary | ICD-10-CM | POA: Diagnosis not present

## 2020-06-30 DIAGNOSIS — Z720 Tobacco use: Secondary | ICD-10-CM | POA: Diagnosis not present

## 2020-06-30 DIAGNOSIS — J441 Chronic obstructive pulmonary disease with (acute) exacerbation: Secondary | ICD-10-CM | POA: Diagnosis not present

## 2020-07-01 DIAGNOSIS — R69 Illness, unspecified: Secondary | ICD-10-CM | POA: Diagnosis not present

## 2020-07-15 DIAGNOSIS — Z1211 Encounter for screening for malignant neoplasm of colon: Secondary | ICD-10-CM | POA: Diagnosis not present

## 2020-08-06 DIAGNOSIS — R0602 Shortness of breath: Secondary | ICD-10-CM | POA: Diagnosis not present

## 2020-08-06 DIAGNOSIS — R69 Illness, unspecified: Secondary | ICD-10-CM | POA: Diagnosis not present

## 2020-08-06 DIAGNOSIS — J449 Chronic obstructive pulmonary disease, unspecified: Secondary | ICD-10-CM | POA: Diagnosis not present

## 2020-08-06 DIAGNOSIS — Z720 Tobacco use: Secondary | ICD-10-CM | POA: Diagnosis not present

## 2020-08-07 DIAGNOSIS — D696 Thrombocytopenia, unspecified: Secondary | ICD-10-CM | POA: Diagnosis not present

## 2020-08-07 DIAGNOSIS — R161 Splenomegaly, not elsewhere classified: Secondary | ICD-10-CM | POA: Diagnosis not present

## 2020-08-07 DIAGNOSIS — Z720 Tobacco use: Secondary | ICD-10-CM | POA: Diagnosis not present

## 2020-08-07 DIAGNOSIS — J431 Panlobular emphysema: Secondary | ICD-10-CM | POA: Diagnosis not present

## 2020-08-07 DIAGNOSIS — Z122 Encounter for screening for malignant neoplasm of respiratory organs: Secondary | ICD-10-CM | POA: Diagnosis not present

## 2020-08-14 DIAGNOSIS — E785 Hyperlipidemia, unspecified: Secondary | ICD-10-CM | POA: Diagnosis not present

## 2020-08-14 DIAGNOSIS — E1140 Type 2 diabetes mellitus with diabetic neuropathy, unspecified: Secondary | ICD-10-CM | POA: Diagnosis not present

## 2020-08-14 DIAGNOSIS — K219 Gastro-esophageal reflux disease without esophagitis: Secondary | ICD-10-CM | POA: Diagnosis not present

## 2020-08-14 DIAGNOSIS — E1169 Type 2 diabetes mellitus with other specified complication: Secondary | ICD-10-CM | POA: Diagnosis not present

## 2020-08-14 DIAGNOSIS — J441 Chronic obstructive pulmonary disease with (acute) exacerbation: Secondary | ICD-10-CM | POA: Diagnosis not present

## 2020-08-14 DIAGNOSIS — E559 Vitamin D deficiency, unspecified: Secondary | ICD-10-CM | POA: Diagnosis not present

## 2020-08-14 DIAGNOSIS — E1165 Type 2 diabetes mellitus with hyperglycemia: Secondary | ICD-10-CM | POA: Diagnosis not present

## 2020-08-18 DIAGNOSIS — D696 Thrombocytopenia, unspecified: Secondary | ICD-10-CM | POA: Diagnosis not present

## 2020-08-20 DIAGNOSIS — E1169 Type 2 diabetes mellitus with other specified complication: Secondary | ICD-10-CM | POA: Diagnosis not present

## 2020-08-20 DIAGNOSIS — E785 Hyperlipidemia, unspecified: Secondary | ICD-10-CM | POA: Diagnosis not present

## 2020-08-20 DIAGNOSIS — J449 Chronic obstructive pulmonary disease, unspecified: Secondary | ICD-10-CM | POA: Diagnosis not present

## 2020-08-20 DIAGNOSIS — R69 Illness, unspecified: Secondary | ICD-10-CM | POA: Diagnosis not present

## 2020-08-20 DIAGNOSIS — E7801 Familial hypercholesterolemia: Secondary | ICD-10-CM | POA: Diagnosis not present

## 2020-08-20 DIAGNOSIS — Z0001 Encounter for general adult medical examination with abnormal findings: Secondary | ICD-10-CM | POA: Diagnosis not present

## 2020-08-20 DIAGNOSIS — Z20828 Contact with and (suspected) exposure to other viral communicable diseases: Secondary | ICD-10-CM | POA: Diagnosis not present

## 2020-08-20 DIAGNOSIS — Z6828 Body mass index (BMI) 28.0-28.9, adult: Secondary | ICD-10-CM | POA: Diagnosis not present

## 2020-08-28 DIAGNOSIS — D759 Disease of blood and blood-forming organs, unspecified: Secondary | ICD-10-CM | POA: Diagnosis not present

## 2020-08-28 DIAGNOSIS — Z923 Personal history of irradiation: Secondary | ICD-10-CM | POA: Diagnosis not present

## 2020-08-28 DIAGNOSIS — D696 Thrombocytopenia, unspecified: Secondary | ICD-10-CM | POA: Diagnosis not present

## 2020-08-28 DIAGNOSIS — K7581 Nonalcoholic steatohepatitis (NASH): Secondary | ICD-10-CM | POA: Diagnosis not present

## 2020-08-28 DIAGNOSIS — Z720 Tobacco use: Secondary | ICD-10-CM | POA: Diagnosis not present

## 2020-08-28 DIAGNOSIS — R161 Splenomegaly, not elsewhere classified: Secondary | ICD-10-CM | POA: Diagnosis not present

## 2020-08-28 DIAGNOSIS — Z86000 Personal history of in-situ neoplasm of breast: Secondary | ICD-10-CM | POA: Diagnosis not present

## 2020-08-28 DIAGNOSIS — Z7189 Other specified counseling: Secondary | ICD-10-CM | POA: Diagnosis not present

## 2020-08-29 DIAGNOSIS — Z7984 Long term (current) use of oral hypoglycemic drugs: Secondary | ICD-10-CM | POA: Diagnosis not present

## 2020-08-29 DIAGNOSIS — E1165 Type 2 diabetes mellitus with hyperglycemia: Secondary | ICD-10-CM | POA: Diagnosis not present

## 2020-08-29 DIAGNOSIS — J441 Chronic obstructive pulmonary disease with (acute) exacerbation: Secondary | ICD-10-CM | POA: Diagnosis not present

## 2020-08-29 DIAGNOSIS — Z720 Tobacco use: Secondary | ICD-10-CM | POA: Diagnosis not present

## 2020-09-21 DIAGNOSIS — Z01818 Encounter for other preprocedural examination: Secondary | ICD-10-CM | POA: Diagnosis not present

## 2020-09-24 DIAGNOSIS — Z1211 Encounter for screening for malignant neoplasm of colon: Secondary | ICD-10-CM | POA: Diagnosis not present

## 2020-09-24 DIAGNOSIS — J441 Chronic obstructive pulmonary disease with (acute) exacerbation: Secondary | ICD-10-CM | POA: Diagnosis not present

## 2020-09-24 DIAGNOSIS — Z7982 Long term (current) use of aspirin: Secondary | ICD-10-CM | POA: Diagnosis not present

## 2020-09-24 DIAGNOSIS — E7800 Pure hypercholesterolemia, unspecified: Secondary | ICD-10-CM | POA: Diagnosis not present

## 2020-09-24 DIAGNOSIS — K644 Residual hemorrhoidal skin tags: Secondary | ICD-10-CM | POA: Diagnosis not present

## 2020-09-24 DIAGNOSIS — R69 Illness, unspecified: Secondary | ICD-10-CM | POA: Diagnosis not present

## 2020-09-24 DIAGNOSIS — Z7984 Long term (current) use of oral hypoglycemic drugs: Secondary | ICD-10-CM | POA: Diagnosis not present

## 2020-09-24 DIAGNOSIS — K641 Second degree hemorrhoids: Secondary | ICD-10-CM | POA: Diagnosis not present

## 2020-09-24 DIAGNOSIS — E119 Type 2 diabetes mellitus without complications: Secondary | ICD-10-CM | POA: Diagnosis not present

## 2020-09-24 DIAGNOSIS — J449 Chronic obstructive pulmonary disease, unspecified: Secondary | ICD-10-CM | POA: Diagnosis not present

## 2020-09-24 DIAGNOSIS — K5730 Diverticulosis of large intestine without perforation or abscess without bleeding: Secondary | ICD-10-CM | POA: Diagnosis not present

## 2020-09-28 DIAGNOSIS — J441 Chronic obstructive pulmonary disease with (acute) exacerbation: Secondary | ICD-10-CM | POA: Diagnosis not present

## 2020-09-28 DIAGNOSIS — Z7984 Long term (current) use of oral hypoglycemic drugs: Secondary | ICD-10-CM | POA: Diagnosis not present

## 2020-09-28 DIAGNOSIS — Z720 Tobacco use: Secondary | ICD-10-CM | POA: Diagnosis not present

## 2020-09-28 DIAGNOSIS — E1165 Type 2 diabetes mellitus with hyperglycemia: Secondary | ICD-10-CM | POA: Diagnosis not present

## 2020-10-28 DIAGNOSIS — Z720 Tobacco use: Secondary | ICD-10-CM | POA: Diagnosis not present

## 2020-10-28 DIAGNOSIS — J441 Chronic obstructive pulmonary disease with (acute) exacerbation: Secondary | ICD-10-CM | POA: Diagnosis not present

## 2020-10-28 DIAGNOSIS — Z7984 Long term (current) use of oral hypoglycemic drugs: Secondary | ICD-10-CM | POA: Diagnosis not present

## 2020-10-28 DIAGNOSIS — E1165 Type 2 diabetes mellitus with hyperglycemia: Secondary | ICD-10-CM | POA: Diagnosis not present

## 2020-11-19 DIAGNOSIS — I700 Atherosclerosis of aorta: Secondary | ICD-10-CM | POA: Diagnosis not present

## 2020-11-19 DIAGNOSIS — J302 Other seasonal allergic rhinitis: Secondary | ICD-10-CM | POA: Diagnosis not present

## 2020-11-19 DIAGNOSIS — E7801 Familial hypercholesterolemia: Secondary | ICD-10-CM | POA: Diagnosis not present

## 2020-11-19 DIAGNOSIS — E1169 Type 2 diabetes mellitus with other specified complication: Secondary | ICD-10-CM | POA: Diagnosis not present

## 2020-11-19 DIAGNOSIS — Z6827 Body mass index (BMI) 27.0-27.9, adult: Secondary | ICD-10-CM | POA: Diagnosis not present

## 2020-11-19 DIAGNOSIS — J449 Chronic obstructive pulmonary disease, unspecified: Secondary | ICD-10-CM | POA: Diagnosis not present

## 2020-11-19 DIAGNOSIS — R69 Illness, unspecified: Secondary | ICD-10-CM | POA: Diagnosis not present

## 2020-11-19 DIAGNOSIS — Z20828 Contact with and (suspected) exposure to other viral communicable diseases: Secondary | ICD-10-CM | POA: Diagnosis not present

## 2020-11-28 DIAGNOSIS — Z7984 Long term (current) use of oral hypoglycemic drugs: Secondary | ICD-10-CM | POA: Diagnosis not present

## 2020-11-28 DIAGNOSIS — Z720 Tobacco use: Secondary | ICD-10-CM | POA: Diagnosis not present

## 2020-11-28 DIAGNOSIS — E1165 Type 2 diabetes mellitus with hyperglycemia: Secondary | ICD-10-CM | POA: Diagnosis not present

## 2020-11-28 DIAGNOSIS — J441 Chronic obstructive pulmonary disease with (acute) exacerbation: Secondary | ICD-10-CM | POA: Diagnosis not present

## 2020-12-14 DIAGNOSIS — L729 Follicular cyst of the skin and subcutaneous tissue, unspecified: Secondary | ICD-10-CM | POA: Diagnosis not present

## 2020-12-14 DIAGNOSIS — R69 Illness, unspecified: Secondary | ICD-10-CM | POA: Diagnosis not present

## 2020-12-14 DIAGNOSIS — L819 Disorder of pigmentation, unspecified: Secondary | ICD-10-CM | POA: Diagnosis not present

## 2020-12-14 DIAGNOSIS — J449 Chronic obstructive pulmonary disease, unspecified: Secondary | ICD-10-CM | POA: Diagnosis not present

## 2020-12-14 DIAGNOSIS — Z1389 Encounter for screening for other disorder: Secondary | ICD-10-CM | POA: Diagnosis not present

## 2020-12-14 DIAGNOSIS — Z1331 Encounter for screening for depression: Secondary | ICD-10-CM | POA: Diagnosis not present

## 2020-12-14 DIAGNOSIS — F17210 Nicotine dependence, cigarettes, uncomplicated: Secondary | ICD-10-CM | POA: Diagnosis not present

## 2020-12-14 DIAGNOSIS — I872 Venous insufficiency (chronic) (peripheral): Secondary | ICD-10-CM | POA: Diagnosis not present

## 2020-12-21 DIAGNOSIS — D696 Thrombocytopenia, unspecified: Secondary | ICD-10-CM | POA: Diagnosis not present

## 2020-12-21 DIAGNOSIS — C50011 Malignant neoplasm of nipple and areola, right female breast: Secondary | ICD-10-CM | POA: Diagnosis not present

## 2020-12-28 DIAGNOSIS — E1165 Type 2 diabetes mellitus with hyperglycemia: Secondary | ICD-10-CM | POA: Diagnosis not present

## 2020-12-28 DIAGNOSIS — Z7984 Long term (current) use of oral hypoglycemic drugs: Secondary | ICD-10-CM | POA: Diagnosis not present

## 2020-12-28 DIAGNOSIS — J441 Chronic obstructive pulmonary disease with (acute) exacerbation: Secondary | ICD-10-CM | POA: Diagnosis not present

## 2020-12-28 DIAGNOSIS — Z720 Tobacco use: Secondary | ICD-10-CM | POA: Diagnosis not present

## 2020-12-31 DIAGNOSIS — I878 Other specified disorders of veins: Secondary | ICD-10-CM | POA: Diagnosis not present

## 2020-12-31 DIAGNOSIS — I872 Venous insufficiency (chronic) (peripheral): Secondary | ICD-10-CM | POA: Diagnosis not present

## 2020-12-31 DIAGNOSIS — R9389 Abnormal findings on diagnostic imaging of other specified body structures: Secondary | ICD-10-CM | POA: Diagnosis not present

## 2021-01-12 DIAGNOSIS — D696 Thrombocytopenia, unspecified: Secondary | ICD-10-CM | POA: Diagnosis not present

## 2021-01-28 DIAGNOSIS — E1165 Type 2 diabetes mellitus with hyperglycemia: Secondary | ICD-10-CM | POA: Diagnosis not present

## 2021-01-28 DIAGNOSIS — J441 Chronic obstructive pulmonary disease with (acute) exacerbation: Secondary | ICD-10-CM | POA: Diagnosis not present

## 2021-01-28 DIAGNOSIS — Z720 Tobacco use: Secondary | ICD-10-CM | POA: Diagnosis not present

## 2021-01-28 DIAGNOSIS — Z7984 Long term (current) use of oral hypoglycemic drugs: Secondary | ICD-10-CM | POA: Diagnosis not present

## 2021-02-16 DIAGNOSIS — J449 Chronic obstructive pulmonary disease, unspecified: Secondary | ICD-10-CM | POA: Diagnosis not present

## 2021-02-16 DIAGNOSIS — E785 Hyperlipidemia, unspecified: Secondary | ICD-10-CM | POA: Diagnosis not present

## 2021-02-16 DIAGNOSIS — J302 Other seasonal allergic rhinitis: Secondary | ICD-10-CM | POA: Diagnosis not present

## 2021-02-16 DIAGNOSIS — E7801 Familial hypercholesterolemia: Secondary | ICD-10-CM | POA: Diagnosis not present

## 2021-02-16 DIAGNOSIS — R69 Illness, unspecified: Secondary | ICD-10-CM | POA: Diagnosis not present

## 2021-02-16 DIAGNOSIS — E1169 Type 2 diabetes mellitus with other specified complication: Secondary | ICD-10-CM | POA: Diagnosis not present

## 2021-03-09 DIAGNOSIS — Z0100 Encounter for examination of eyes and vision without abnormal findings: Secondary | ICD-10-CM | POA: Diagnosis not present

## 2021-03-09 DIAGNOSIS — Z961 Presence of intraocular lens: Secondary | ICD-10-CM | POA: Diagnosis not present

## 2021-03-09 DIAGNOSIS — H524 Presbyopia: Secondary | ICD-10-CM | POA: Diagnosis not present

## 2021-03-09 DIAGNOSIS — Z7984 Long term (current) use of oral hypoglycemic drugs: Secondary | ICD-10-CM | POA: Diagnosis not present

## 2021-03-09 DIAGNOSIS — E119 Type 2 diabetes mellitus without complications: Secondary | ICD-10-CM | POA: Diagnosis not present

## 2021-03-29 ENCOUNTER — Other Ambulatory Visit: Payer: Self-pay | Admitting: Physician Assistant

## 2021-03-31 DIAGNOSIS — Z720 Tobacco use: Secondary | ICD-10-CM | POA: Diagnosis not present

## 2021-03-31 DIAGNOSIS — Z7984 Long term (current) use of oral hypoglycemic drugs: Secondary | ICD-10-CM | POA: Diagnosis not present

## 2021-03-31 DIAGNOSIS — J441 Chronic obstructive pulmonary disease with (acute) exacerbation: Secondary | ICD-10-CM | POA: Diagnosis not present

## 2021-03-31 DIAGNOSIS — E1165 Type 2 diabetes mellitus with hyperglycemia: Secondary | ICD-10-CM | POA: Diagnosis not present

## 2021-04-15 DIAGNOSIS — N390 Urinary tract infection, site not specified: Secondary | ICD-10-CM | POA: Diagnosis not present

## 2021-04-15 DIAGNOSIS — Z6828 Body mass index (BMI) 28.0-28.9, adult: Secondary | ICD-10-CM | POA: Diagnosis not present

## 2021-04-15 DIAGNOSIS — R69 Illness, unspecified: Secondary | ICD-10-CM | POA: Diagnosis not present

## 2021-04-19 DIAGNOSIS — Z1231 Encounter for screening mammogram for malignant neoplasm of breast: Secondary | ICD-10-CM | POA: Diagnosis not present

## 2021-04-29 DIAGNOSIS — R69 Illness, unspecified: Secondary | ICD-10-CM | POA: Diagnosis not present

## 2021-04-29 DIAGNOSIS — J449 Chronic obstructive pulmonary disease, unspecified: Secondary | ICD-10-CM | POA: Diagnosis not present

## 2021-04-29 DIAGNOSIS — Z20828 Contact with and (suspected) exposure to other viral communicable diseases: Secondary | ICD-10-CM | POA: Diagnosis not present

## 2021-04-29 DIAGNOSIS — Z6828 Body mass index (BMI) 28.0-28.9, adult: Secondary | ICD-10-CM | POA: Diagnosis not present

## 2021-05-21 DIAGNOSIS — J449 Chronic obstructive pulmonary disease, unspecified: Secondary | ICD-10-CM | POA: Diagnosis not present

## 2021-05-21 DIAGNOSIS — R69 Illness, unspecified: Secondary | ICD-10-CM | POA: Diagnosis not present

## 2021-05-21 DIAGNOSIS — F17200 Nicotine dependence, unspecified, uncomplicated: Secondary | ICD-10-CM | POA: Diagnosis not present

## 2021-05-21 DIAGNOSIS — E1165 Type 2 diabetes mellitus with hyperglycemia: Secondary | ICD-10-CM | POA: Diagnosis not present

## 2021-05-21 DIAGNOSIS — Z6828 Body mass index (BMI) 28.0-28.9, adult: Secondary | ICD-10-CM | POA: Diagnosis not present

## 2021-05-21 DIAGNOSIS — Z23 Encounter for immunization: Secondary | ICD-10-CM | POA: Diagnosis not present

## 2021-06-30 DIAGNOSIS — Z7984 Long term (current) use of oral hypoglycemic drugs: Secondary | ICD-10-CM | POA: Diagnosis not present

## 2021-06-30 DIAGNOSIS — J441 Chronic obstructive pulmonary disease with (acute) exacerbation: Secondary | ICD-10-CM | POA: Diagnosis not present

## 2021-06-30 DIAGNOSIS — Z720 Tobacco use: Secondary | ICD-10-CM | POA: Diagnosis not present

## 2021-06-30 DIAGNOSIS — E1165 Type 2 diabetes mellitus with hyperglycemia: Secondary | ICD-10-CM | POA: Diagnosis not present

## 2021-07-07 DIAGNOSIS — D696 Thrombocytopenia, unspecified: Secondary | ICD-10-CM | POA: Diagnosis not present

## 2021-07-07 DIAGNOSIS — K7581 Nonalcoholic steatohepatitis (NASH): Secondary | ICD-10-CM | POA: Diagnosis not present

## 2021-07-14 DIAGNOSIS — D696 Thrombocytopenia, unspecified: Secondary | ICD-10-CM | POA: Diagnosis not present

## 2021-07-21 DIAGNOSIS — Z6827 Body mass index (BMI) 27.0-27.9, adult: Secondary | ICD-10-CM | POA: Diagnosis not present

## 2021-07-21 DIAGNOSIS — J302 Other seasonal allergic rhinitis: Secondary | ICD-10-CM | POA: Diagnosis not present

## 2021-07-21 DIAGNOSIS — E1169 Type 2 diabetes mellitus with other specified complication: Secondary | ICD-10-CM | POA: Diagnosis not present

## 2021-07-21 DIAGNOSIS — R69 Illness, unspecified: Secondary | ICD-10-CM | POA: Diagnosis not present

## 2021-07-21 DIAGNOSIS — E785 Hyperlipidemia, unspecified: Secondary | ICD-10-CM | POA: Diagnosis not present

## 2021-07-21 DIAGNOSIS — E1165 Type 2 diabetes mellitus with hyperglycemia: Secondary | ICD-10-CM | POA: Diagnosis not present

## 2021-07-21 DIAGNOSIS — J449 Chronic obstructive pulmonary disease, unspecified: Secondary | ICD-10-CM | POA: Diagnosis not present

## 2021-07-21 DIAGNOSIS — E7801 Familial hypercholesterolemia: Secondary | ICD-10-CM | POA: Diagnosis not present

## 2021-07-21 DIAGNOSIS — F17200 Nicotine dependence, unspecified, uncomplicated: Secondary | ICD-10-CM | POA: Diagnosis not present

## 2021-09-21 DIAGNOSIS — E1140 Type 2 diabetes mellitus with diabetic neuropathy, unspecified: Secondary | ICD-10-CM | POA: Diagnosis not present

## 2021-09-21 DIAGNOSIS — E7800 Pure hypercholesterolemia, unspecified: Secondary | ICD-10-CM | POA: Diagnosis not present

## 2021-09-21 DIAGNOSIS — E559 Vitamin D deficiency, unspecified: Secondary | ICD-10-CM | POA: Diagnosis not present

## 2021-09-21 DIAGNOSIS — Z1329 Encounter for screening for other suspected endocrine disorder: Secondary | ICD-10-CM | POA: Diagnosis not present

## 2021-09-21 DIAGNOSIS — K219 Gastro-esophageal reflux disease without esophagitis: Secondary | ICD-10-CM | POA: Diagnosis not present

## 2021-09-21 DIAGNOSIS — E7801 Familial hypercholesterolemia: Secondary | ICD-10-CM | POA: Diagnosis not present

## 2021-09-24 ENCOUNTER — Other Ambulatory Visit: Payer: Self-pay | Admitting: Family Medicine

## 2021-09-24 ENCOUNTER — Other Ambulatory Visit (HOSPITAL_COMMUNITY): Payer: Self-pay | Admitting: Family Medicine

## 2021-09-24 DIAGNOSIS — J449 Chronic obstructive pulmonary disease, unspecified: Secondary | ICD-10-CM | POA: Diagnosis not present

## 2021-09-24 DIAGNOSIS — Z0001 Encounter for general adult medical examination with abnormal findings: Secondary | ICD-10-CM | POA: Diagnosis not present

## 2021-09-24 DIAGNOSIS — J302 Other seasonal allergic rhinitis: Secondary | ICD-10-CM | POA: Diagnosis not present

## 2021-09-24 DIAGNOSIS — E785 Hyperlipidemia, unspecified: Secondary | ICD-10-CM | POA: Diagnosis not present

## 2021-09-24 DIAGNOSIS — E1169 Type 2 diabetes mellitus with other specified complication: Secondary | ICD-10-CM | POA: Diagnosis not present

## 2021-09-24 DIAGNOSIS — F17200 Nicotine dependence, unspecified, uncomplicated: Secondary | ICD-10-CM | POA: Diagnosis not present

## 2021-09-24 DIAGNOSIS — Z6827 Body mass index (BMI) 27.0-27.9, adult: Secondary | ICD-10-CM | POA: Diagnosis not present

## 2021-09-24 DIAGNOSIS — F17210 Nicotine dependence, cigarettes, uncomplicated: Secondary | ICD-10-CM

## 2021-09-24 DIAGNOSIS — E7801 Familial hypercholesterolemia: Secondary | ICD-10-CM | POA: Diagnosis not present

## 2021-09-27 DIAGNOSIS — Z6827 Body mass index (BMI) 27.0-27.9, adult: Secondary | ICD-10-CM | POA: Diagnosis not present

## 2021-09-27 DIAGNOSIS — J441 Chronic obstructive pulmonary disease with (acute) exacerbation: Secondary | ICD-10-CM | POA: Diagnosis not present

## 2021-09-27 DIAGNOSIS — M810 Age-related osteoporosis without current pathological fracture: Secondary | ICD-10-CM | POA: Diagnosis not present

## 2021-09-27 DIAGNOSIS — E785 Hyperlipidemia, unspecified: Secondary | ICD-10-CM | POA: Diagnosis not present

## 2021-09-27 DIAGNOSIS — F17200 Nicotine dependence, unspecified, uncomplicated: Secondary | ICD-10-CM | POA: Diagnosis not present

## 2021-09-27 DIAGNOSIS — E1140 Type 2 diabetes mellitus with diabetic neuropathy, unspecified: Secondary | ICD-10-CM | POA: Diagnosis not present

## 2021-09-27 DIAGNOSIS — I270 Primary pulmonary hypertension: Secondary | ICD-10-CM | POA: Diagnosis not present

## 2021-09-27 DIAGNOSIS — Z0001 Encounter for general adult medical examination with abnormal findings: Secondary | ICD-10-CM | POA: Diagnosis not present

## 2021-09-28 DIAGNOSIS — E1165 Type 2 diabetes mellitus with hyperglycemia: Secondary | ICD-10-CM | POA: Diagnosis not present

## 2021-09-28 DIAGNOSIS — R040 Epistaxis: Secondary | ICD-10-CM | POA: Diagnosis not present

## 2021-09-28 DIAGNOSIS — F17210 Nicotine dependence, cigarettes, uncomplicated: Secondary | ICD-10-CM | POA: Diagnosis not present

## 2021-09-28 DIAGNOSIS — J441 Chronic obstructive pulmonary disease with (acute) exacerbation: Secondary | ICD-10-CM | POA: Diagnosis not present

## 2021-09-28 DIAGNOSIS — J342 Deviated nasal septum: Secondary | ICD-10-CM | POA: Diagnosis not present

## 2021-10-05 ENCOUNTER — Other Ambulatory Visit: Payer: Self-pay | Admitting: *Deleted

## 2021-10-05 DIAGNOSIS — M79673 Pain in unspecified foot: Secondary | ICD-10-CM

## 2021-10-06 ENCOUNTER — Encounter: Payer: Self-pay | Admitting: Vascular Surgery

## 2021-10-06 ENCOUNTER — Other Ambulatory Visit: Payer: Self-pay

## 2021-10-06 ENCOUNTER — Ambulatory Visit (INDEPENDENT_AMBULATORY_CARE_PROVIDER_SITE_OTHER): Payer: No Typology Code available for payment source

## 2021-10-06 ENCOUNTER — Ambulatory Visit (INDEPENDENT_AMBULATORY_CARE_PROVIDER_SITE_OTHER): Payer: No Typology Code available for payment source | Admitting: Vascular Surgery

## 2021-10-06 VITALS — BP 156/68 | HR 80 | Ht 63.0 in | Wt 151.6 lb

## 2021-10-06 DIAGNOSIS — M79673 Pain in unspecified foot: Secondary | ICD-10-CM

## 2021-10-06 NOTE — Progress Notes (Signed)
? ? ?[facility] [place] ? ?Patient name: [name] MRN: [id number] DOB: 24-Dec-1950 Sex: female ? ?REASON FOR CONSULT: Evaluation lower extremity discomfort ? ?HPI: ?[name] is a 71 y.o. female, who is here today for discussion of lower extremity discomfort and rule out arterial disease.  She had undergone noninvasive studies approximately 1 year ago suggesting mild arterial disease.  She reports nocturnal cramping but denies any claudication type symptoms.  She did have extensive fracture in her right ankle and has some discomfort associated to this old injury.  She does have evidence of venous hypertension with varicosities and hemosiderin deposits bilaterally.  She also appears to have rices on her left foot and ankle. ? ?Past Medical History:  ?Diagnosis Date  ? Angio-edema   ? Anxiety   ? Cancer [facility])   ? breast cancer  ? COPD (chronic obstructive pulmonary disease) ([place])   ? Diabetes mellitus   ? Emphysema/COPD [facility])   ? History of shingles 06/2011  ? Neuromuscular disorder ([place])   ? Shortness of breath   ? ? ?Family History  ?Problem Relation Age of Onset  ? Bowel Disease Mother   ? Hypertension Mother   ? Psoriasis Mother   ? Heart attack Father   ? Hypertension Father   ? Heart disease Brother   ? Aneurysm Brother   ? ? ?SOCIAL HISTORY: ?Social History  ? ?Socioeconomic History  ? Marital status: Widowed  ?  Spouse name: Not on file  ? Number of children: Not on file  ? Years of education: Not on file  ? Highest education level: Not on file  ?Occupational History  ? Not on file  ?Tobacco Use  ? Smoking status: Every Day  ?  Packs/day: 1.00  ?  Years: 50.00  ?  Pack years: 50.00  ?  Types: Cigarettes  ? Smokeless tobacco: Never  ? Tobacco comments:  ?   trying to quit  ?Vaping Use  ? Vaping Use: Former  ?Substance and Sexual Activity  ? Alcohol use: Yes  ?  Comment: occasionally  ? Drug use: No  ? Sexual activity: Yes  ?  Birth  control/protection: Post-menopausal  ?Other Topics Concern  ? Not on file  ?Social History Narrative  ? Not on file  ? ?Social Determinants of Health  ? ?Financial Resource Strain: Not on file  ?Food Insecurity: Not on file  ?Transportation Needs: Not on file  ?Physical Activity: Not on file  ?Stress: Not on file  ?Social Connections: Not on file  ?Intimate Partner Violence: Not on file  ? ? ?Allergies  ?Allergen Reactions  ? Vicodin [Hydrocodone-Acetaminophen] Itching  ? ? ?Current Outpatient Medications  ?Medication Sig Dispense Refill  ? albuterol (ACCUNEB) 0.63 MG/3ML nebulizer solution Take 1 ampule by nebulization every 6 (six) hours as needed for wheezing.    ? calcium carbonate (OS-CAL) 600 MG TABS tablet Take 1,200 mg by mouth daily.    ? celecoxib (CELEBREX) 200 MG capsule Take 200 mg by mouth daily.    ? cholecalciferol (VITAMIN D) 400 UNITS TABS tablet Take 400 Units by mouth daily.    ? cyanocobalamin 1000 MCG tablet Take 1,000 mcg by mouth daily.    ? montelukast (SINGULAIR) 10 MG tablet Take 1 tablet (10 mg total) by mouth at bedtime. 30 tablet 5  ? omeprazole (PRILOSEC) 20 MG capsule Take 20 mg by mouth daily.    ? Semaglutide, 1 MG/DOSE, (OZEMPIC, 1 MG/DOSE,) 2 MG/1.5ML SOPN Inject  1 mg into the skin once a week.    ? umeclidinium-vilanterol (ANORO ELLIPTA) 62.5-25 MCG/ACT AEPB Inhale 1 puff into the lungs daily.    ? fish oil-omega-3 fatty acids 1000 MG capsule Take 2 g by mouth daily.      ? ?No current facility-administered medications for this visit.  ? ? ?REVIEW OF SYSTEMS:  ?'[X]'$  denotes positive finding, '[ ]'$  denotes negative finding ?Cardiac  Comments:  ?Chest pain or chest pressure:    ?Shortness of breath upon exertion:    ?Short of breath when lying flat:    ?Irregular heart rhythm:    ?    ?Vascular    ?Pain in calf, thigh, or hip brought on by ambulation:    ?Pain in feet at night that wakes you up from your sleep:     ?Blood clot in your veins:    ?Leg swelling:     ?    ?Pulmonary     ?Oxygen at home:    ?Productive cough:  x   ?Wheezing:     ?    ?Neurologic    ?Sudden weakness in arms or legs:     ?Sudden numbness in arms or legs:     ?Sudden onset of difficulty speaking or slurred speech:    ?Temporary loss of vision in one eye:     ?Problems with dizziness:     ?    ?Gastrointestinal    ?Blood in stool:     ?Vomited blood:     ?    ?Genitourinary    ?Burning when urinating:     ?Blood in urine:    ?    ?Psychiatric    ?Major depression:     ?    ?Hematologic    ?Bleeding problems:    ?Problems with blood clotting too easily:    ?    ?Skin    ?Rashes or ulcers:    ?    ?Constitutional    ?Fever or chills:    ? ? ?PHYSICAL EXAM: ?Vitals:  ? 10/06/21 1540  ?BP: (!) 156/68  ?Pulse: 80  ?Weight: 151 lb 9.6 oz (68.8 kg)  ?Height: '5\' 3"'$  (1.6 m)  ? ? ?GENERAL: The patient is a well-nourished female, in no acute distress. The vital signs are documented above. ?CARDIOVASCULAR: 2+ radial pulses bilaterally.  2+ femoral, 2+ popliteal, 2+ dorsalis pedis and posterior tibial pulses bilaterally. ?PULMONARY: There is good air exchange  ?MUSCULOSKELETAL: There are no major deformities or cyanosis. ?NEUROLOGIC: No focal weakness or paresthesias are detected. ?SKIN: Rash over her left distal calf ankle and foot consistent with psoriasis.  She does have hemosiderin deposits at her ankles bilaterally.  She does have varicosities in her right posterior calf ?PSYCHIATRIC: The patient has a normal affect. ? ?DATA:  ?Noninvasive studies in our office today reveal near normal arterial flow with an ankle arm index of 0.96 on the right and 0.87 on the left with triphasic waveforms ? ?MEDICAL ISSUES: ?No evidence of significant arterial disease bilaterally.  She does have evidence of venous hypertension particularly in the right leg with varicosities and telangiectasia over her ankle.  She is not having any symptoms related to this.  I have reassured her that I do not see any concerning arterial or venous pathology.   She will see [place] again on an as-needed basis ? ? ?Rosetta Posner, MD FACS ?[facility] [place] ?Office Tel [phone] ?Pager [phone] ? ?Note: Portions of  this report may have been transcribed using voice recognition software.  Every effort has been made to ensure accuracy; however, inadvertent computerized transcription errors may still be present. ? ?

## 2021-10-12 DIAGNOSIS — R161 Splenomegaly, not elsewhere classified: Secondary | ICD-10-CM | POA: Diagnosis not present

## 2021-10-12 DIAGNOSIS — Z9049 Acquired absence of other specified parts of digestive tract: Secondary | ICD-10-CM | POA: Diagnosis not present

## 2021-10-12 DIAGNOSIS — K7581 Nonalcoholic steatohepatitis (NASH): Secondary | ICD-10-CM | POA: Diagnosis not present

## 2021-10-12 DIAGNOSIS — D696 Thrombocytopenia, unspecified: Secondary | ICD-10-CM | POA: Diagnosis not present

## 2021-10-19 DIAGNOSIS — D696 Thrombocytopenia, unspecified: Secondary | ICD-10-CM | POA: Diagnosis not present

## 2021-10-19 DIAGNOSIS — K769 Liver disease, unspecified: Secondary | ICD-10-CM | POA: Diagnosis not present

## 2021-10-29 DIAGNOSIS — K760 Fatty (change of) liver, not elsewhere classified: Secondary | ICD-10-CM | POA: Diagnosis not present

## 2021-10-29 DIAGNOSIS — R161 Splenomegaly, not elsewhere classified: Secondary | ICD-10-CM | POA: Diagnosis not present

## 2021-10-29 DIAGNOSIS — Z853 Personal history of malignant neoplasm of breast: Secondary | ICD-10-CM | POA: Diagnosis not present

## 2021-11-02 DIAGNOSIS — D751 Secondary polycythemia: Secondary | ICD-10-CM | POA: Diagnosis not present

## 2021-11-02 DIAGNOSIS — Z720 Tobacco use: Secondary | ICD-10-CM | POA: Diagnosis not present

## 2021-11-02 DIAGNOSIS — R161 Splenomegaly, not elsewhere classified: Secondary | ICD-10-CM | POA: Diagnosis not present

## 2021-11-02 DIAGNOSIS — D696 Thrombocytopenia, unspecified: Secondary | ICD-10-CM | POA: Diagnosis not present

## 2021-11-02 DIAGNOSIS — K760 Fatty (change of) liver, not elsewhere classified: Secondary | ICD-10-CM | POA: Diagnosis not present

## 2021-11-04 ENCOUNTER — Ambulatory Visit (HOSPITAL_COMMUNITY)
Admission: RE | Admit: 2021-11-04 | Discharge: 2021-11-04 | Disposition: A | Discharge status: Home / Self Care | Payer: No Typology Code available for payment source | Source: Ambulatory Visit | Attending: Family Medicine | Admitting: Family Medicine

## 2021-11-04 DIAGNOSIS — F17210 Nicotine dependence, cigarettes, uncomplicated: Secondary | ICD-10-CM | POA: Insufficient documentation

## 2021-12-29 DIAGNOSIS — Z7984 Long term (current) use of oral hypoglycemic drugs: Secondary | ICD-10-CM | POA: Diagnosis not present

## 2021-12-29 DIAGNOSIS — J441 Chronic obstructive pulmonary disease with (acute) exacerbation: Secondary | ICD-10-CM | POA: Diagnosis not present

## 2021-12-29 DIAGNOSIS — E1165 Type 2 diabetes mellitus with hyperglycemia: Secondary | ICD-10-CM | POA: Diagnosis not present

## 2022-01-27 DIAGNOSIS — Z825 Family history of asthma and other chronic lower respiratory diseases: Secondary | ICD-10-CM | POA: Diagnosis not present

## 2022-01-27 DIAGNOSIS — J439 Emphysema, unspecified: Secondary | ICD-10-CM | POA: Diagnosis not present

## 2022-01-27 DIAGNOSIS — Z7985 Long-term (current) use of injectable non-insulin antidiabetic drugs: Secondary | ICD-10-CM | POA: Diagnosis not present

## 2022-01-27 DIAGNOSIS — Z885 Allergy status to narcotic agent status: Secondary | ICD-10-CM | POA: Diagnosis not present

## 2022-01-27 DIAGNOSIS — J302 Other seasonal allergic rhinitis: Secondary | ICD-10-CM | POA: Diagnosis not present

## 2022-01-27 DIAGNOSIS — K219 Gastro-esophageal reflux disease without esophagitis: Secondary | ICD-10-CM | POA: Diagnosis not present

## 2022-01-27 DIAGNOSIS — E785 Hyperlipidemia, unspecified: Secondary | ICD-10-CM | POA: Diagnosis not present

## 2022-01-27 DIAGNOSIS — R32 Unspecified urinary incontinence: Secondary | ICD-10-CM | POA: Diagnosis not present

## 2022-01-27 DIAGNOSIS — Z8249 Family history of ischemic heart disease and other diseases of the circulatory system: Secondary | ICD-10-CM | POA: Diagnosis not present

## 2022-01-27 DIAGNOSIS — R69 Illness, unspecified: Secondary | ICD-10-CM | POA: Diagnosis not present

## 2022-01-27 DIAGNOSIS — E119 Type 2 diabetes mellitus without complications: Secondary | ICD-10-CM | POA: Diagnosis not present

## 2022-02-02 DIAGNOSIS — D696 Thrombocytopenia, unspecified: Secondary | ICD-10-CM | POA: Diagnosis not present

## 2022-02-02 DIAGNOSIS — C50011 Malignant neoplasm of nipple and areola, right female breast: Secondary | ICD-10-CM | POA: Diagnosis not present

## 2022-02-02 DIAGNOSIS — Z86000 Personal history of in-situ neoplasm of breast: Secondary | ICD-10-CM | POA: Diagnosis not present

## 2022-02-08 DIAGNOSIS — K7581 Nonalcoholic steatohepatitis (NASH): Secondary | ICD-10-CM | POA: Diagnosis not present

## 2022-02-08 DIAGNOSIS — D696 Thrombocytopenia, unspecified: Secondary | ICD-10-CM | POA: Diagnosis not present

## 2022-02-21 ENCOUNTER — Encounter (INDEPENDENT_AMBULATORY_CARE_PROVIDER_SITE_OTHER): Payer: Self-pay | Admitting: *Deleted

## 2022-03-21 DIAGNOSIS — M21372 Foot drop, left foot: Secondary | ICD-10-CM | POA: Diagnosis not present

## 2022-03-21 DIAGNOSIS — Z6825 Body mass index (BMI) 25.0-25.9, adult: Secondary | ICD-10-CM | POA: Diagnosis not present

## 2022-03-21 DIAGNOSIS — R69 Illness, unspecified: Secondary | ICD-10-CM | POA: Diagnosis not present

## 2022-03-21 DIAGNOSIS — E785 Hyperlipidemia, unspecified: Secondary | ICD-10-CM | POA: Diagnosis not present

## 2022-03-21 DIAGNOSIS — J302 Other seasonal allergic rhinitis: Secondary | ICD-10-CM | POA: Diagnosis not present

## 2022-03-21 DIAGNOSIS — J449 Chronic obstructive pulmonary disease, unspecified: Secondary | ICD-10-CM | POA: Diagnosis not present

## 2022-03-21 DIAGNOSIS — E7801 Familial hypercholesterolemia: Secondary | ICD-10-CM | POA: Diagnosis not present

## 2022-03-21 DIAGNOSIS — E1169 Type 2 diabetes mellitus with other specified complication: Secondary | ICD-10-CM | POA: Diagnosis not present

## 2022-03-21 DIAGNOSIS — R030 Elevated blood-pressure reading, without diagnosis of hypertension: Secondary | ICD-10-CM | POA: Diagnosis not present

## 2022-03-24 ENCOUNTER — Ambulatory Visit: Payer: Medicare HMO | Admitting: Neurology

## 2022-03-24 ENCOUNTER — Encounter: Payer: Self-pay | Admitting: Neurology

## 2022-03-24 VITALS — BP 135/68 | HR 69 | Ht 63.0 in | Wt 139.0 lb

## 2022-03-24 DIAGNOSIS — E1142 Type 2 diabetes mellitus with diabetic polyneuropathy: Secondary | ICD-10-CM

## 2022-03-24 DIAGNOSIS — M21372 Foot drop, left foot: Secondary | ICD-10-CM

## 2022-03-24 NOTE — Progress Notes (Signed)
Chief Complaint  Patient presents with   New Patient (Initial Visit)    Rm 14, with daughter   NP proficient paper referral for left foot drop Started late July 2023      ASSESSMENT AND PLAN  [name] is a 71 y.o. 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 Laboratory evaluations in February 2023, normal CMP, hemoglobin 15.9, normal TSH A1c was 5.9, LDL 55, vitamin D 86  MEDICAL HISTORY:  [name], is a 71 year old female seen in request by primary Dr. [place] Nation, for evaluation of left foot drop, she is accompanied by her Desmond Dike at today's visit March 24, 2022  I reviewed and summarized the referring note. PMHx.  Left breast Cancer in 2009, s/p lobectomy with radiation COPD, Smoker GERD DM  Left hand CTS Right thigh shingles in the past.  She had long history of diabetes, developed bilateral feet plantar surface sensitivity over the past few years, could not tolerate walking barefoot, heavy smoker for many years, COPD,  She began to become active around April 2023, walking couple miles a day, end of July 2023, 1 night she felt left lower extremity cramping, radiating discomfort to left lateral leg, woke up next morning noticed left foot drop, numbness at the top of left foot, has stayed that way since,  She denies upper extremity symptoms, no right leg involvement, no bowel or bladder incontinence, only intermittent midline low back pain, no radiating pain  She tends to sit crosslegged, have  significant tenderness at left fibular head at today's examination   PHYSICAL EXAM:   Vitals:   03/24/22 0925  BP: 135/68  Pulse: 69  Weight: 139 lb (63 kg)  Height: '5\' 3"'$  (1.6 m)   Not recorded     Body mass index is 24.62 kg/m.  PHYSICAL EXAMNIATION:  Gen: NAD, conversant, well nourised, well groomed                     Cardiovascular: Regular rate rhythm, no peripheral edema, warm, nontender. Eyes: Conjunctivae clear without exudates or hemorrhage Neck: Supple, no carotid bruits. Pulmonary: Clear to auscultation bilaterally   NEUROLOGICAL EXAM:  MENTAL STATUS: Speech/cognition: Awake, alert, oriented to history taking and casual conversation CRANIAL NERVES: CN II: Visual fields are full to confrontation. Pupils are round equal and briskly reactive to light. CN III, IV, VI: extraocular movement are normal. No ptosis. CN V: Facial sensation is intact to light touch CN VII: Face is symmetric with normal eye closure  CN VIII: Hearing is normal to causal conversation. CN IX, X: Phonation is normal. CN XI: Head turning and shoulder shrug are intact  MOTOR: Bilateral Lower extremity proximal muscle strength is normal, including bilateral hip flexion, hip abduction, hip adduction, knee extension, knee flexion,  No antigravity movement of left ankle dorsi flexion, moderate weakness of left eversion, well-preserved bilateral ankle plantarflexion, inversion,  Significant tenderness around left fibular head upon deep palpation, but no palpable mass  REFLEXES: Reflexes are 1 and symmetric at the biceps, triceps, knees, and  absent at ankles. Plantar responses are flexor.  SENSORY: Decreased vibratory sensation to ankle level, length-dependent decreased to pinprick to mid shin level, skin discoloration of shin, decreased light touch at the distribution of left common peroneal nerve, including first webspace  COORDINATION: There is no trunk or limb dysmetria  noted.  GAIT/STANCE: Push-up to get up from sitting position, left foot drop  REVIEW OF SYSTEMS:  Full 14 system review of systems performed and notable only for as above All other review of systems were negative.   ALLERGIES: Allergies  Allergen Reactions   Vicodin [Hydrocodone-Acetaminophen] Itching    HOME MEDICATIONS: Current Outpatient Medications  Medication Sig Dispense Refill   albuterol (ACCUNEB) 0.63 MG/3ML nebulizer solution Take 1 ampule by nebulization every 6 (six) hours as needed for wheezing.     calcium carbonate (OS-CAL) 600 MG TABS tablet Take 1,200 mg by mouth daily.     celecoxib (CELEBREX) 200 MG capsule Take 200 mg by mouth daily.     cholecalciferol (VITAMIN D) 400 UNITS TABS tablet Take 400 Units by mouth daily.     cyanocobalamin 1000 MCG tablet Take 1,000 mcg by mouth daily.     fish oil-omega-3 fatty acids 1000 MG capsule Take 2 g by mouth daily.       montelukast (SINGULAIR) 10 MG tablet Take 1 tablet (10 mg total) by mouth at bedtime. 30 tablet 5   omeprazole (PRILOSEC) 20 MG capsule Take 20 mg by mouth daily.     Semaglutide, 1 MG/DOSE, (OZEMPIC, 1 MG/DOSE,) 2 MG/1.5ML SOPN Inject 1 mg into the skin once a week.     umeclidinium-vilanterol (ANORO ELLIPTA) 62.5-25 MCG/ACT AEPB Inhale 1 puff into the lungs daily.     No current facility-administered medications for this visit.    PAST MEDICAL HISTORY: Past Medical History:  Diagnosis Date   Angio-edema    Anxiety    Cancer [facility])    breast cancer   COPD (chronic obstructive pulmonary disease) ([place])    Diabetes mellitus    Emphysema/COPD ([place])    History of shingles 06/2011   Neuromuscular disorder ([place])    Shortness of breath     PAST SURGICAL HISTORY: Past Surgical History:  Procedure Laterality Date   [place] and 2000   CARPAL TUNNEL RELEASE Left 02/13/2018   Procedure: LEFT CARPAL TUNNEL RELEASE;  Surgeon: Daryll Brod, MD;  Location: [place];  Service: Orthopedics;  Laterality: Left;   CATARACT EXTRACTION W/PHACO Right 09/10/2013   Procedure: CATARACT EXTRACTION PHACO AND INTRAOCULAR LENS PLACEMENT (IOC);  Surgeon: Elta Guadeloupe T. Gershon Crane, MD;  Location: [facility];  Service: Ophthalmology;  Laterality: Right;  CDE:12.81   CHOLECYSTECTOMY  2000   CYST REMOVAL HAND Right    right wrist   FASCIECTOMY Left 02/13/2018   Procedure: FASCIECTOMY LEFT RING;  Surgeon: Daryll Brod, MD;  Location: [place];  Service: Orthopedics;  Laterality: Left;  REG/AXILLARY BLOCK   FRACTURE SURGERY  1994   HERNIA REPAIR  2007   MASS EXCISION  08/11/2011   Procedure: EXCISION MASS;  Surgeon: Cammie Sickle., MD;  Location: [place];  Service: Orthopedics;  Laterality: Right;  Synovectomy of extnsor tendon right long finger   Rotator cuff Right    TUBAL LIGATION  1974   ULNAR NERVE TRANSPOSITION Left 02/13/2018   Procedure: DECOMPRESSION LEFT ULNA NERVE;  Surgeon: Daryll Brod, MD;  Location: [place];  Service: Orthopedics;  Laterality: Left;    FAMILY HISTORY: Family History  Problem Relation Age of Onset   Bowel Disease Mother    Hypertension Mother    Psoriasis Mother    Heart attack Father    Hypertension Father    Heart disease Brother    Aneurysm Brother     SOCIAL HISTORY: Social History   Socioeconomic History   Marital status: Widowed    Spouse name: Not on file   Number of children: Not on file   Years of education: Not on file   Highest education level: Not on file  Occupational History   Not on file  Tobacco Use   Smoking status: Every Day    Packs/day: 1.00    Years: 50.00    Total pack years: 50.00    Types: Cigarettes   Smokeless tobacco: Never   Tobacco comments:     trying to quit  Vaping Use   Vaping Use: Former  Substance and Sexual Activity   Alcohol use: Yes    Comment: occasionally   Drug use: No   Sexual activity: Yes    Birth  control/protection: Post-menopausal  Other Topics Concern   Not on file  Social History Narrative   Not on file   Social Determinants of Health   Financial Resource Strain: Not on file  Food Insecurity: Not on file  Transportation Needs: Not on file  Physical Activity: Not on file  Stress: Not on file  Social Connections: Not on file  Intimate Partner Violence: Not on file      Marcial Pacas, M.D. Ph.D.  [facility] [address] [place], [place] [address] Ph: [phone] Fax: [phone]  CC:  Sultana Nation, MD [place],  [place] [id number]  [place] Nation, MD

## 2022-03-29 DIAGNOSIS — M21372 Foot drop, left foot: Secondary | ICD-10-CM | POA: Diagnosis not present

## 2022-03-31 ENCOUNTER — Telehealth: Payer: Self-pay | Admitting: Neurology

## 2022-03-31 DIAGNOSIS — E7800 Pure hypercholesterolemia, unspecified: Secondary | ICD-10-CM | POA: Diagnosis not present

## 2022-03-31 DIAGNOSIS — E1140 Type 2 diabetes mellitus with diabetic neuropathy, unspecified: Secondary | ICD-10-CM | POA: Diagnosis not present

## 2022-03-31 DIAGNOSIS — K219 Gastro-esophageal reflux disease without esophagitis: Secondary | ICD-10-CM | POA: Diagnosis not present

## 2022-03-31 NOTE — Telephone Encounter (Signed)
[employer] medicare Josem Kaufmann: [id number] exp. 03/31/22-09/21/22 sent to [facility]

## 2022-04-19 ENCOUNTER — Ambulatory Visit (HOSPITAL_COMMUNITY)
Admission: RE | Admit: 2022-04-19 | Discharge: 2022-04-19 | Disposition: A | Discharge status: Home / Self Care | Payer: Medicare HMO | Source: Ambulatory Visit | Attending: Neurology | Admitting: Neurology

## 2022-04-19 DIAGNOSIS — M21372 Foot drop, left foot: Secondary | ICD-10-CM | POA: Insufficient documentation

## 2022-04-19 DIAGNOSIS — G8929 Other chronic pain: Secondary | ICD-10-CM | POA: Diagnosis not present

## 2022-04-19 DIAGNOSIS — M79605 Pain in left leg: Secondary | ICD-10-CM | POA: Diagnosis not present

## 2022-04-19 DIAGNOSIS — E1142 Type 2 diabetes mellitus with diabetic polyneuropathy: Secondary | ICD-10-CM | POA: Diagnosis not present

## 2022-04-19 DIAGNOSIS — M19072 Primary osteoarthritis, left ankle and foot: Secondary | ICD-10-CM | POA: Diagnosis not present

## 2022-04-19 MED ORDER — GADOPICLENOL 0.5 MMOL/ML IV SOLN
10.0000 mL | Freq: Once | INTRAVENOUS | Status: AC | PRN
  Administered 2022-04-19: 10 mL via INTRAVENOUS

## 2022-04-20 ENCOUNTER — Telehealth: Payer: Self-pay | Admitting: Neurology

## 2022-04-20 NOTE — Telephone Encounter (Signed)
Please call patient, MRI of left lower extremity showed evidence of left anterior shin muscle changes, consistent with left peroneal nerve injury  Check to see if she has any improvement of her left ankle weakness  Advised to keep scheduled EMG nerve conduction study appointment   1. T2 signal abnormality in the anterior compartment musculature which could suggest a peroneal nerve injury/denervation process. No mass is identified along the course of the nerve. 2. No findings for left tibia or fibula stress fracture. 3. Diffuse patchy T1 and T2 marrow signal bilaterally could be due to smoking, anemia, osteoporosis or residual red marrow elements.

## 2022-04-20 NOTE — Telephone Encounter (Addendum)
Pt verified by name and DOB,  normal results given per provider, pt voiced understanding all question answered.  States weakness has not improved and will keep appt with dr Krista Blue.

## 2022-04-21 DIAGNOSIS — Z1231 Encounter for screening mammogram for malignant neoplasm of breast: Secondary | ICD-10-CM | POA: Diagnosis not present

## 2022-05-02 DIAGNOSIS — R69 Illness, unspecified: Secondary | ICD-10-CM | POA: Diagnosis not present

## 2022-05-02 DIAGNOSIS — J449 Chronic obstructive pulmonary disease, unspecified: Secondary | ICD-10-CM | POA: Diagnosis not present

## 2022-05-02 DIAGNOSIS — S8410XA Injury of peroneal nerve at lower leg level, unspecified leg, initial encounter: Secondary | ICD-10-CM | POA: Diagnosis not present

## 2022-05-02 DIAGNOSIS — J302 Other seasonal allergic rhinitis: Secondary | ICD-10-CM | POA: Diagnosis not present

## 2022-05-02 DIAGNOSIS — E1169 Type 2 diabetes mellitus with other specified complication: Secondary | ICD-10-CM | POA: Diagnosis not present

## 2022-05-02 DIAGNOSIS — E785 Hyperlipidemia, unspecified: Secondary | ICD-10-CM | POA: Diagnosis not present

## 2022-05-02 DIAGNOSIS — E7801 Familial hypercholesterolemia: Secondary | ICD-10-CM | POA: Diagnosis not present

## 2022-05-02 DIAGNOSIS — Z23 Encounter for immunization: Secondary | ICD-10-CM | POA: Diagnosis not present

## 2022-05-02 DIAGNOSIS — Z6825 Body mass index (BMI) 25.0-25.9, adult: Secondary | ICD-10-CM | POA: Diagnosis not present

## 2022-05-02 DIAGNOSIS — M21372 Foot drop, left foot: Secondary | ICD-10-CM | POA: Diagnosis not present

## 2022-05-16 ENCOUNTER — Encounter (INDEPENDENT_AMBULATORY_CARE_PROVIDER_SITE_OTHER): Payer: Self-pay | Admitting: Gastroenterology

## 2022-05-16 ENCOUNTER — Ambulatory Visit (INDEPENDENT_AMBULATORY_CARE_PROVIDER_SITE_OTHER): Payer: Medicare HMO | Admitting: Gastroenterology

## 2022-05-16 VITALS — BP 119/71 | HR 75 | Temp 97.4°F | Ht 63.0 in | Wt 141.0 lb

## 2022-05-16 DIAGNOSIS — K7581 Nonalcoholic steatohepatitis (NASH): Secondary | ICD-10-CM

## 2022-05-16 DIAGNOSIS — K219 Gastro-esophageal reflux disease without esophagitis: Secondary | ICD-10-CM | POA: Diagnosis not present

## 2022-05-16 MED ORDER — FAMOTIDINE 20 MG PO TABS: 20.0000 mg | ORAL_TABLET | Freq: Every evening | ORAL | 3 refills | Status: DC | PRN

## 2022-05-16 NOTE — Patient Instructions (Signed)
In regards to your acid reflux, Avoid greasy, spicy, fried, citrus foods, and be mindful that caffeine, carbonated drinks, chocolate and alcohol can increase reflux symptoms, Stay upright 2-3 hours after eating, prior to lying down and avoid eating late in the evenings. You can try elevating the head of your bed as well. I have provided famotidine '20mg'$  for you to take as needed prior to bedtime, this can help with the acid reflux symptoms you are having at night  I am providing the mediterranean diet, as we discussed this is a liver healthy diet, please try to get atleast 30 minutes of physical activity 4-5x/week. Most importantly is maintaining a healthy diet and lifestyle to prevent advancement of your liver disease to cirrhosis.  Follow up 1 year

## 2022-05-16 NOTE — Progress Notes (Signed)
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 female with past medical history of anxiety, breast cancer, COPD, DM, neuromuscular disorder.   Patient presenting today as a new patient for NASH.  Notably with thrombocytopenia for the past few years she has undergone bone marrow biopsy for in January 2022 that was normal.  -Labs in July with normal LFTs, plt count 87k  -[place] Abd complete 10/12/21 Splenomegaly, Mildly heterogeneous increased liver echotexture consistent with hepatic steatosis, Indeterminate circumscribed 1.8 cm hypoechoic structure right lobe liver, suspect a cyst.  -MRI Abd w /wo in march 2023 with Mild fatty deposition in the liver parenchyma,  Mild splenomegaly. The 18 mm hypoechoic lesion seen on recent ultrasound is not  evident by MRI. No suspicious hepatic lesion by MRI today  Present: Patient states that she was referred here by the cancer center due to fatty liver. She notes she has lost about 30 pounds over the past year. She states she has DM and was started on an injection for this which she feels precipitated her weight loss. She states that diet is pretty random and does note that she eats a decent amount of junk food. She was walking 2 miles a day previously until she developed drop foot which she is being treated for, having nerve conduction testing in a few weeks. She has no swelling to abdomen or legs.   She does report in the past 6 months she has had 2-3 episodes where she will wake up and feel that she is choking/having regurgitation, will spit up some phlegm when this occurs. She notes that she only really has heartburn sometimes with coffee. Sometimes does eat ice cream before bed. She is recently changed her omeprazole to 38m every other at advice of her  PCP.   No red flag symptoms. Patient denies melena, hematochezia, nausea, vomiting, diarrhea, constipation, dysphagia, odyonophagia, early satiety or weight loss.   NSAID use: occasional BC powder  Social hx: smokes 1 pack every 2 days, very rare alcohol use Fam hx: sister had cirrhosis  Last Colonoscopy:09/24/20 - Hemorrhoids found on perianal exam.  - Diverticulosis in the sigmoid colon.  - Tortuous colon.  - Non-bleeding internal hemorrhoids.   - No specimens collected.  Last Endoscopy:never  Recommendations:  Repeat colonoscopy in 10 years   Past Medical History:  Diagnosis Date   Angio-edema    Anxiety    Cancer ([facility]    breast cancer   COPD (chronic obstructive [place] disease) [place]    Diabetes mellitus    [place]    History of shingles 06/2011   [place]rom[place]    Shortness of breath     Past Surgical History:  Procedure Laterality Date   [place]   CARPAL TUNNEL RELEASE Left 02/13/2018   Procedure: LEFT CARPAL TUNNEL RELEASE;  Surgeon: KDaryll Brod MD;  [place]  Service: Orthopedics;  Laterality: Left;   CATARACT EXTRACTION W/PHACO Right 09/10/2013   Procedure: CATARACT EXTRACTION PHACO AND INTRAOCULAR LENS PLACEMENT (IOC);  Surgeon: MElta GuadeloupeT. SGershon Crane MD;  Location: [facility];  Service: Ophthalmology;  Laterality: Right;  CDE:12.81   CHOLECYSTECTOMY  2000   CYST REMOVAL HAND Right  right wrist   FASCIECTOMY Left 02/13/2018   Procedure: FASCIECTOMY LEFT RING;  Surgeon: Daryll Brod, MD;  Location: [place];  Service: Orthopedics;  Laterality: Left;  REG/AXILLARY BLOCK   FRACTURE SURGERY  1994   HERNIA REPAIR  2007   MASS EXCISION  08/11/2011   Procedure: EXCISION MASS;  Surgeon: Cammie Sickle., MD;  Location: [place];  Service: Orthopedics;  Laterality: Right;  Synovectomy of extnsor tendon right long finger   Rotator  cuff Right    TUBAL LIGATION  1974   ULNAR NERVE TRANSPOSITION Left 02/13/2018   Procedure: DECOMPRESSION LEFT ULNA NERVE;  Surgeon: Daryll Brod, MD;  Location: [place];  Service: Orthopedics;  Laterality: Left;    Current Outpatient Medications  Medication Sig Dispense Refill   calcium carbonate (OS-CAL) 600 MG TABS tablet Take 1,200 mg by mouth daily.     celecoxib (CELEBREX) 200 MG capsule Take 200 mg by mouth daily.     cholecalciferol (VITAMIN D) 400 UNITS TABS tablet Take 400 Units by mouth daily.     cyanocobalamin 1000 MCG tablet Take 1,000 mcg by mouth daily.     fish oil-omega-3 fatty acids 1000 MG capsule Take 2 g by mouth daily.       montelukast (SINGULAIR) 10 MG tablet Take 1 tablet (10 mg total) by mouth at bedtime. 30 tablet 5   omeprazole (PRILOSEC) 20 MG capsule Take 20 mg by mouth daily.     pravastatin (PRAVACHOL) 40 MG tablet Take 40 mg by mouth daily.     Semaglutide, 1 MG/DOSE, (OZEMPIC, 1 MG/DOSE,) 2 MG/1.5ML SOPN Inject 1 mg into the skin once a week.     umeclidinium-vilanterol (ANORO ELLIPTA) 62.5-25 MCG/ACT AEPB Inhale 1 puff into the lungs daily.     albuterol (ACCUNEB) 0.63 MG/3ML nebulizer solution Take 1 ampule by nebulization every 6 (six) hours as needed for wheezing. (Patient not taking: Reported on 05/16/2022)     No current facility-administered medications for this visit.    Allergies as of 05/16/2022 - Review Complete 05/16/2022  Allergen Reaction Noted   Vicodin [hydrocodone-acetaminophen] Itching 08/08/2011    Family History  Problem Relation Age of Onset   Bowel Disease Mother    Hypertension Mother    Psoriasis Mother    Heart attack Father    Hypertension Father    Heart disease Brother    Aneurysm Brother     Social History   Socioeconomic History   Marital status: Widowed    Spouse name: Not on file   Number of children: Not on file   Years of education: Not on file   Highest education level: Not on file   Occupational History   Not on file  Tobacco Use   Smoking status: Every Day    Packs/day: 1.00    Years: 50.00    Total pack years: 50.00    Types: Cigarettes   Smokeless tobacco: Never   Tobacco comments:     trying to quit  Vaping Use   Vaping Use: Former  Substance and Sexual Activity   Alcohol use: Yes    Comment: occasionally   Drug use: No   Sexual activity: Yes    Birth control/protection: Post-menopausal  Other Topics Concern   Not on file  Social History Narrative   Not on file   Social Determinants of Health   Financial Resource Strain: Not on file  Food Insecurity: Not on file  Transportation Needs: Not  on file  Physical Activity: Not on file  Stress: Not on file  Social Connections: Not on file   Review of systems General: negative for malaise, night sweats, fever, chills, weight loss Neck: Negative for lumps, goiter, pain and significant neck swelling Resp: Negative for cough, wheezing, dyspnea at rest CV: Negative for chest pain, leg swelling, palpitations, orthopnea GI: denies melena, hematochezia, nausea, vomiting, diarrhea, constipation, dysphagia, odyonophagia, early satiety or unintentional weight loss. +acid regurgitation  MSK: Negative for joint pain or swelling, back pain, and muscle pain. Derm: Negative for itching or rash Psych: Denies depression, anxiety, memory loss, confusion. No homicidal or suicidal ideation.  Heme: Negative for prolonged bleeding, bruising easily, and swollen nodes. Endocrine: Negative for cold or heat intolerance, polyuria, polydipsia and goiter. Neuro: negative for tremor, gait imbalance, syncope and seizures. The remainder of the review of systems is noncontributory.  Physical Exam: BP 119/71   Pulse 75   Temp (!) 97.4 F (36.3 C)   Ht 5\' 3"  (1.6 m)   Wt 141 lb (64 kg)   BMI 24.98 kg/m  General:   Alert and oriented. No distress noted. Pleasant and cooperative.  Head:  Normocephalic and atraumatic. Eyes:   Conjuctiva clear without scleral icterus. Mouth:  Oral mucosa pink and moist. Good dentition. No lesions. Heart: Normal rate and rhythm, s1 and s2 heart sounds present.  Lungs: Clear lung sounds in all lobes. Respirations equal and unlabored. Abdomen:  +BS, soft, non-tender and non-distended. No rebound or guarding. No HSM or masses noted. Derm: No palmar erythema or jaundice Msk:  Symmetrical without gross deformities. Normal posture. Extremities:  Without edema. Neurologic:  Alert and  oriented x4 Psych:  Alert and cooperative. Normal mood and affect.  Invalid input(s): "6 MONTHS"   ASSESSMENT: [name] is a 71 y.o. female presenting today for NASH and GERD.   Recent US and MRI of abdomen both with hepatic steatosis. Notably with thrombocytopenia for the past few years with previously normal liver biopsy, also with presence of splenomegaly. LFTs have remained normal throughout. We discussed implications of presence of NASH and that this is usually well managed with dietary and lifestyle modifications, however, in rare cases, this can progress to fibrosis/cirrhosis of the liver, which is serious. She should implement the mediterranean diet, get atleast 30 minutes of exercise 4-5x/week with proper weight management and continue to avoid alcohol as this can worsen liver issues. She is being followed by hematology regarding splenomegaly.   In regards to her GERD, she recently changed to PPI every other day at advice of her PCP, however, even prior to this, over the past 6 months she has had 2-3 episodes of severe acid regurgitation that wakes her up at night. We discussed importance of reflux precautions to include elevating HOB, Avoiding greasy, spicy, fried, citrus foods, and be mindful that caffeine, carbonated drinks, chocolate and alcohol can increase reflux symptoms and Staying upright 2-3 hours after eating, prior to lying down and avoid eating late in the evenings, will add famotidine  20mg  QHS PRN to see if this helps reduce symptoms.    PLAN:  Mediterranean diet  2. Exercise 30 mins/day, 4-5x/week  3. Weight management  4. Continue Omeprazole 20mg  every other day  5. Famotidine 20mg  QHS PRN  6. Reflux precautions   All questions were answered, patient verbalized understanding and is in agreement with plan as outlined above.   Follow Up: 1 year   Lanier Felty L. Alver Sorrow, MSN, APRN, AGNP-C Adult-Gerontology Nurse  Practitioner [facility]  I have reviewed the note and agree with the APP's assessment as described in this progress note  NAFLD score was 1.69, although possibly largely driven by platelet count and DM. Will order NASH fibrosure. If reflux is persistent, will need to take daily PPI.  Maylon Peppers, MD Gastroenterology and Hepatology [facility]

## 2022-05-23 ENCOUNTER — Other Ambulatory Visit (INDEPENDENT_AMBULATORY_CARE_PROVIDER_SITE_OTHER): Payer: Self-pay | Admitting: Gastroenterology

## 2022-05-23 ENCOUNTER — Telehealth (INDEPENDENT_AMBULATORY_CARE_PROVIDER_SITE_OTHER): Payer: Self-pay

## 2022-05-23 DIAGNOSIS — D696 Thrombocytopenia, unspecified: Secondary | ICD-10-CM

## 2022-05-23 DIAGNOSIS — K7581 Nonalcoholic steatohepatitis (NASH): Secondary | ICD-10-CM

## 2022-05-23 NOTE — Telephone Encounter (Signed)
I called and left a message asked that the patient please return call.  Per Vikki Ports the patient needs to have [employer] done at [facility].

## 2022-05-23 NOTE — Telephone Encounter (Signed)
Patient aware to have the testing done at [facility].

## 2022-05-24 DIAGNOSIS — Z139 Encounter for screening, unspecified: Secondary | ICD-10-CM | POA: Diagnosis not present

## 2022-05-24 DIAGNOSIS — K7581 Nonalcoholic steatohepatitis (NASH): Secondary | ICD-10-CM | POA: Diagnosis not present

## 2022-05-24 DIAGNOSIS — D696 Thrombocytopenia, unspecified: Secondary | ICD-10-CM | POA: Diagnosis not present

## 2022-05-26 LAB — NASH FIBROSURE(R) PLUS
ALPHA 2-MACROGLOBULINS, QN: 344 mg/dL — ABNORMAL HIGH (ref 110–276)
ALT (SGPT) P5P: 16 IU/L (ref 0–40)
AST (SGOT) P5P: 23 IU/L (ref 0–40)
Apolipoprotein A-1: 137 mg/dL (ref 116–209)
Bilirubin, Total: 0.4 mg/dL (ref 0.0–1.2)
Cholesterol, Total: 136 mg/dL (ref 100–199)
Fibrosis Score: 0.64 — ABNORMAL HIGH (ref 0.00–0.21)
GGT: 55 IU/L (ref 0–60)
Glucose: 107 mg/dL — ABNORMAL HIGH (ref 70–99)
Haptoglobin: 84 mg/dL (ref 42–346)
NASH Score: 0.66 — ABNORMAL HIGH (ref 0.00–0.25)
Steatosis Score: 0.54 — ABNORMAL HIGH (ref 0.00–0.40)
Triglycerides: 172 mg/dL — ABNORMAL HIGH (ref 0–149)

## 2022-06-01 ENCOUNTER — Ambulatory Visit (INDEPENDENT_AMBULATORY_CARE_PROVIDER_SITE_OTHER): Payer: Medicare HMO | Admitting: Neurology

## 2022-06-01 ENCOUNTER — Ambulatory Visit: Payer: Medicare HMO | Admitting: Neurology

## 2022-06-01 VITALS — BP 126/65 | HR 73 | Ht 63.0 in | Wt 140.0 lb

## 2022-06-01 DIAGNOSIS — E559 Vitamin D deficiency, unspecified: Secondary | ICD-10-CM | POA: Diagnosis not present

## 2022-06-01 DIAGNOSIS — G6289 Other specified polyneuropathies: Secondary | ICD-10-CM | POA: Diagnosis not present

## 2022-06-01 DIAGNOSIS — R7989 Other specified abnormal findings of blood chemistry: Secondary | ICD-10-CM | POA: Diagnosis not present

## 2022-06-01 DIAGNOSIS — R799 Abnormal finding of blood chemistry, unspecified: Secondary | ICD-10-CM | POA: Diagnosis not present

## 2022-06-01 DIAGNOSIS — E1142 Type 2 diabetes mellitus with diabetic polyneuropathy: Secondary | ICD-10-CM

## 2022-06-01 DIAGNOSIS — M21372 Foot drop, left foot: Secondary | ICD-10-CM

## 2022-06-01 DIAGNOSIS — R768 Other specified abnormal immunological findings in serum: Secondary | ICD-10-CM | POA: Diagnosis not present

## 2022-06-01 DIAGNOSIS — R760 Raised antibody titer: Secondary | ICD-10-CM | POA: Diagnosis not present

## 2022-06-01 DIAGNOSIS — G629 Polyneuropathy, unspecified: Secondary | ICD-10-CM | POA: Insufficient documentation

## 2022-06-01 DIAGNOSIS — R7309 Other abnormal glucose: Secondary | ICD-10-CM | POA: Diagnosis not present

## 2022-06-01 MED ORDER — DULOXETINE HCL 60 MG PO CPEP: 60.0000 mg | ORAL_CAPSULE | Freq: Every day | ORAL | 3 refills | Status: DC

## 2022-06-01 MED ORDER — DULOXETINE HCL 20 MG PO CPEP: 20.0000 mg | ORAL_CAPSULE | Freq: Every day | ORAL | 0 refills | Status: DC

## 2022-06-01 NOTE — Procedures (Signed)
Full Name: [name] Gender: Female MRN #: [id number] Date of Birth: August 11, 1950    Visit Date: 06/01/2022 10:02 Age: 71 Years Examining Physician: Dr. Marcial Pacas Referring Physician: Dr. Marcial Pacas Height: 5 feet 3 inch History: 71 year old female, presenting with left foot drop sensory loss since July 2023, now has made moderate improvement, she also complains of intermittent left hand paresthesia  Summary of the test  Nerve conduction study: Left superficial peroneal sensory response was absent.  Bilateral sural sensory responses were normal.  Right superficial peroneal sensory responses were within normal limit.  Left median, ulnar sensory response showed normal peak latency with mildly decreased snap amplitude.  Left peroneal to extensor digitorum brevis, tibialis muscle were absent.  Right peroneal to EDB muscle was also absent.  Right peroneal to tibialis anterior muscle was present, but it was difficult to elicit clear response below right fibular head, robust response above right fibular head.  Left median motor responses showed normal distal latency, CMAP amplitude.  2  Electromyography: Selected needle examination was performed at bilateral lower extremity muscles, bilateral lumbosacral paraspinal muscles.  There is evidence of mild active denervation, polyphasic motor unit potential involving left tibialis anterior, peroneal longus, also involving left biceps femoris short head.  There is also evidence of mild chronic neuropathic changes involving right distal neck muscles.  There was no spontaneous activity at bilateral lumbosacral paraspinal muscles.  Conclusion:  This is an abnormal study.  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   ------------------------------- Marcial Pacas, M.D. Ph.D.  [facility] [address], [place], [place] [address] Tel:  [phone] Fax: [phone]  Verbal informed consent was obtained from the patient, patient was informed of potential risk of procedure, including bruising, bleeding, hematoma formation, infection, muscle weakness, muscle pain, numbness, among others.        [place]    Nerve / Sites Muscle Latency Ref. Amplitude Ref. Rel Amp Segments Distance Velocity Ref. Area    ms ms mV mV %  cm m/s m/s mVms  L Median - APB     Wrist APB 3.9 ?4.4 9.4 ?4.0 100 Wrist - APB 7   30.0     Upper arm APB 6.9  9.4  100 Upper arm - Wrist 21 69 ?49 30.8  R Peroneal - EDB     Ankle ADM NR ?3.3 NR ?6.0 NR Ankle - ADM 9   NR  L Peroneal - EDB     Ankle EDB NR ?6.5 NR ?2.0 NR Ankle - EDB 9   NR         Pop fossa - Ankle      R Tibial - AH     Ankle EDB 4.1 ?6.5 15.3 ?2.0 100 Ankle - EDB 9   34.9     Pop fossa EDB 14.7  10.8   Pop fossa - Ankle 45 42  30.9  L Tibial - AH     Ankle AH 4.1 ?5.8 12.2 ?4.0 100 Ankle - AH 9   29.2     Pop fossa AH 13.5  7.8  63.6 Pop fossa - Ankle 34 36 ?41 23.4  L Peroneal - Tib Ant     Fib Head Tib Ant NR ?4.7 NR ?3.0 NR Fib Head - Tib Ant 10   NR  R Peroneal - Tib Ant     Fib Head Tib Ant 8.4 ?4.7 0.9 ?3.0 100  Fib Head - Tib Ant 10   2.6     Pop fossa Tib Ant 4.7  7.3  800 Pop fossa - Fib Head   ?44 68.2                   SNC    Nerve / Sites Rec. Site Peak Lat Ref.  Amp Ref. Segments Distance    ms ms V V  cm  L Sural - Ankle (Calf)     Calf Ankle 4.3 ?4.4 5 ?6 Calf - Ankle 14  R Sural - Ankle (Calf)     Calf Ankle 4.4 ?4.4 7 ?6 Calf - Ankle 14  L Superficial peroneal - Ankle     Lat leg Ankle NR ?4.4 NR ?6 Lat leg - Ankle 14  R Superficial peroneal - Ankle     Lat leg Ankle 4.0 ?4.4 7 ?6 Lat leg - Ankle 14  L Median - Orthodromic (Dig II, Mid palm)     Dig II Wrist 3.3 ?3.4 9 ?10 Dig II - Wrist 13  L Ulnar - Orthodromic, (Dig V, Mid palm)     Dig V Wrist 3.0 ?3.1 6 ?5 Dig V - Wrist 63                 F  Wave    Nerve F Lat Ref.   ms ms  L Tibial - AH 53.4  ?56.0  R Tibial - AH 54.8 ?56.0         EMG Summary Table    Spontaneous MUAP Recruitment  Muscle IA Fib PSW Fasc Other Amp Dur. Poly Pattern  R. Tibialis posterior Increased None None None _______ Normal Normal Normal Reduced  R. Tibialis anterior Normal None None None _______ Normal Normal Normal Normal  R. Peroneus longus Normal None None None _______ Normal Normal Normal Normal  R. Gastrocnemius (Medial head) Normal None None None _______ Normal Normal Normal Normal  R. Vastus lateralis Normal None None None _______ Normal Normal Normal Normal  L. Tibialis anterior Normal 1+ None None _______ Normal Increased 1+ Reduced  L. Tibialis posterior Normal 1+ None None _______ Increased Increased 1+ Reduced  L. Peroneus longus Increased 1+ None None _______ Increased Increased 1+ Reduced  L. Gastrocnemius (Medial head) Increased None None None _______ Normal Normal Normal Reduced  L. Vastus lateralis Normal None None None _______ Normal Normal Normal Normal  L. Biceps femoris (short head) Normal None None None _______ Normal Normal Normal Normal  L. Lumbar paraspinals (low) Normal None None None _______ Normal Normal Normal Normal  L. Lumbar paraspinals (mid) Normal None None None _______ Normal Normal Normal Normal  R. Lumbar paraspinals (low) Normal None None None _______ Normal Normal Normal Normal  R. Lumbar paraspinals (mid) Normal None None None _______ Normal Normal Normal Normal

## 2022-06-01 NOTE — Progress Notes (Signed)
ASSESSMENT AND PLAN  [name] is a 71 y.o. female Acute onset of left foot drop since the end of July 2023 Diabetes, evidence of mild axonal diabetic neuropathy  MRI of left tibial/fibular region showed no evidence of mass compressing left distal sciatic or the left common peroneal nerve,  EMG nerve conduction study today confirmed left distal sciatic neuropathy, with evidence of active denervation, neuropathic changes involving left bicep more short head, left tibialis anterior peroneal longus, relative sparing of the posterior compartment muscles.  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, skin sensitivity chronic neuropathic pain involving bilateral feet  Laboratory evaluation to rule out treatable cause for peripheral neuropathy,  The potential mechanics and for her left a distal sciatic neuropathy including ischemic neuropathy, she has multiple vascular risk factors, longtime smoker, poorly controlled diabetes,  Suggest her increase water intake, take aspirin 81 mg daily, smoke cessation  Neuropathic pain  Add on Cymbalta 20 mg daily, titrating to 60 mg daily,  Return To Clinic With NP In 6 Months   DIAGNOSTIC DATA (LABS, IMAGING, TESTING) - I reviewed patient records, labs, notes, testing and imaging myself where available. Laboratory evaluations in February 2023, normal CMP, hemoglobin 15.9, normal TSH A1c was 5.9, LDL 55, vitamin D 86  MEDICAL HISTORY:  [name], is a 71 year old female seen in request by primary Dr. Asbury Nation, for evaluation of left foot drop, she is accompanied by her Desmond Dike at today's visit March 24, 2022  I reviewed and summarized the referring note. PMHx.  Left breast Cancer in 2009, s/p lobectomy with radiation COPD, Smoker GERD DM  Left hand CTS Right thigh shingles in the past.  She had long history of diabetes, developed bilateral feet plantar surface sensitivity over the past few years,  could not tolerate walking barefoot, heavy smoker for many years, COPD,  She began to become active around April 2023, walking couple miles a day, end of July 2023, 1 night she felt left lower extremity cramping, radiating discomfort to left lateral leg, woke up next morning noticed left foot drop, numbness at the top of left foot, has stayed that way since,  She denies upper extremity symptoms, no right leg involvement, no bowel or bladder incontinence, only intermittent midline low back pain, no radiating pain  She tends to sit crosslegged, have significant tenderness at left fibular head at today's examination  Update June 01, 2022, Patient reported moderate improvement of her left foot drop, especially with left AFO, she can ambulate better,  She continues to smoke at least half pack a day, complains of bilateral foot sensitivity, continue to have left fibular head area sensitivity but improved as well, she has a habit of crossing her legs,  Personally reviewed MRI tibial and fibular with without contrast April 19, 2022, noticeable T2 signal abnormality in anterior compartment musculature suggested peroneal injury/denervation process, no mass identified along the course of the nerve, no finding of left tibial or fibular structure abnormality,  Patient complains of bilateral feet plantar surface sensitivity, shiny skin, sensitivity of bottom of her feet when bear weight    PHYSICAL EXAM:  126/65 heart rate of 73 PHYSICAL EXAMNIATION:  Gen: NAD, conversant, well nourised, well groomed                     Cardiovascular: Regular rate rhythm, no peripheral edema, warm, nontender. Eyes: Conjunctivae clear without exudates or hemorrhage Neck: Supple, no carotid bruits. Pulmonary: Clear to  auscultation bilaterally   NEUROLOGICAL EXAM:  MENTAL STATUS: Speech/cognition: Awake, alert, oriented to history taking and casual conversation CRANIAL NERVES: CN II: Visual fields are full  to confrontation. Pupils are round equal and briskly reactive to light. CN III, IV, VI: extraocular movement are normal. No ptosis. CN V: Facial sensation is intact to light touch CN VII: Face is symmetric with normal eye closure  CN VIII: Hearing is normal to causal conversation. CN IX, X: Phonation is normal. CN XI: Head turning and shoulder shrug are intact  MOTOR: Bilateral Lower extremity proximal muscle strength is normal, including bilateral hip flexion, hip abduction, hip adduction, knee extension, knee flexion, Shiny skin of bilateral foot, distal leg, moderate left ankle dorsiflexion weakness, moderate to severe bilateral toe flexion, extension weakness,  Significant tenderness around left fibular head upon deep palpation, but no palpable mass  REFLEXES: Reflexes are 1 and symmetric at the biceps, triceps, knees, and absent at ankles. Plantar responses are flexor.  SENSORY: Length-dependent decreased light touch, pinprick, vibratory sensation to below knee level  COORDINATION: There is no trunk or limb dysmetria noted.  GAIT/STANCE: Push-up to get up from sitting position, left foot drop, improved after left AFO  REVIEW OF SYSTEMS:  Full 14 system review of systems performed and notable only for as above All other review of systems were negative.   ALLERGIES: Allergies  Allergen Reactions   Vicodin [Hydrocodone-Acetaminophen] Itching    HOME MEDICATIONS: Current Outpatient Medications  Medication Sig Dispense Refill   albuterol (ACCUNEB) 0.63 MG/3ML nebulizer solution Take 1 ampule by nebulization every 6 (six) hours as needed for wheezing. (Patient not taking: Reported on 05/16/2022)     calcium carbonate (OS-CAL) 600 MG TABS tablet Take 1,200 mg by mouth daily.     celecoxib (CELEBREX) 200 MG capsule Take 200 mg by mouth daily.     cholecalciferol (VITAMIN D) 400 UNITS TABS tablet Take 400 Units by mouth daily.     cyanocobalamin 1000 MCG tablet Take 1,000 mcg by  mouth daily.     famotidine (PEPCID) 20 MG tablet Take 1 tablet (20 mg total) by mouth at bedtime as needed for heartburn or indigestion. 60 tablet 3   fish oil-omega-3 fatty acids 1000 MG capsule Take 2 g by mouth daily.       montelukast (SINGULAIR) 10 MG tablet Take 1 tablet (10 mg total) by mouth at bedtime. 30 tablet 5   omeprazole (PRILOSEC) 20 MG capsule Take 20 mg by mouth daily.     pravastatin (PRAVACHOL) 40 MG tablet Take 40 mg by mouth daily.     Semaglutide, 1 MG/DOSE, (OZEMPIC, 1 MG/DOSE,) 2 MG/1.5ML SOPN Inject 1 mg into the skin once a week.     umeclidinium-vilanterol (ANORO ELLIPTA) 62.5-25 MCG/ACT AEPB Inhale 1 puff into the lungs daily.     No current facility-administered medications for this visit.    PAST MEDICAL HISTORY: Past Medical History:  Diagnosis Date   Angio-edema    Anxiety    Cancer [facility])    breast cancer   COPD (chronic obstructive pulmonary disease) ([place])    Diabetes mellitus    Emphysema/COPD ([place])    History of shingles 06/2011   Neuromuscular disorder ([place])    Shortness of breath     PAST SURGICAL HISTORY: Past Surgical History:  Procedure Laterality Date   ABDOMINAL HYSTERECTOMY  1990   BREAST SURGERY  1998 and 2000   CARPAL TUNNEL RELEASE Left 02/13/2018   Procedure: LEFT CARPAL TUNNEL RELEASE;  Surgeon: Daryll Brod, MD;  Location: [place];  Service: Orthopedics;  Laterality: Left;   CATARACT EXTRACTION W/PHACO Right 09/10/2013   Procedure: CATARACT EXTRACTION PHACO AND INTRAOCULAR LENS PLACEMENT (IOC);  Surgeon: Elta Guadeloupe T. Gershon Crane, MD;  Location: [facility];  Service: Ophthalmology;  Laterality: Right;  CDE:12.81   CHOLECYSTECTOMY  2000   CYST REMOVAL HAND Right    right wrist   FASCIECTOMY Left 02/13/2018   Procedure: FASCIECTOMY LEFT RING;  Surgeon: Daryll Brod, MD;  Location: [place];  Service: Orthopedics;  Laterality: Left;  REG/AXILLARY BLOCK   FRACTURE SURGERY  1994   HERNIA REPAIR  2007   MASS  EXCISION  08/11/2011   Procedure: EXCISION MASS;  Surgeon: Cammie Sickle., MD;  Location: [place];  Service: Orthopedics;  Laterality: Right;  Synovectomy of extnsor tendon right long finger   Rotator cuff Right    TUBAL LIGATION  1974   ULNAR NERVE TRANSPOSITION Left 02/13/2018   Procedure: DECOMPRESSION LEFT ULNA NERVE;  Surgeon: Daryll Brod, MD;  Location: [place];  Service: Orthopedics;  Laterality: Left;    FAMILY HISTORY: Family History  Problem Relation Age of Onset   Bowel Disease Mother    Hypertension Mother    Psoriasis Mother    Heart attack Father    Hypertension Father    Heart disease Brother    Aneurysm Brother     SOCIAL HISTORY: Social History   Socioeconomic History   Marital status: Widowed    Spouse name: Not on file   Number of children: Not on file   Years of education: Not on file   Highest education level: Not on file  Occupational History   Not on file  Tobacco Use   Smoking status: Every Day    Packs/day: 1.00    Years: 50.00    Total pack years: 50.00    Types: Cigarettes   Smokeless tobacco: Never   Tobacco comments:     trying to quit  Vaping Use   Vaping Use: Former  Substance and Sexual Activity   Alcohol use: Yes    Comment: occasionally   Drug use: No   Sexual activity: Yes    Birth control/protection: Post-menopausal  Other Topics Concern   Not on file  Social History Narrative   Not on file   Social Determinants of Health   Financial Resource Strain: Not on file  Food Insecurity: Not on file  Transportation Needs: Not on file  Physical Activity: Not on file  Stress: Not on file  Social Connections: Not on file  Intimate Partner Violence: Not on file      Marcial Pacas, M.D. Ph.D.  [facility] [address] [place], [place] [address] Ph: [phone] Fax: [phone]  CC:  [place] Nation, MD [place],  [place] [address]  [place] Nation, MD

## 2022-06-04 NOTE — Progress Notes (Signed)
EMG report is under procedue

## 2022-06-06 LAB — MULTIPLE MYELOMA PANEL, SERUM
Albumin SerPl Elph-Mcnc: 3.9 g/dL (ref 2.9–4.4)
Albumin/Glob SerPl: 1.4 (ref 0.7–1.7)
Alpha 1: 0.3 g/dL (ref 0.0–0.4)
Alpha2 Glob SerPl Elph-Mcnc: 0.6 g/dL (ref 0.4–1.0)
B-Globulin SerPl Elph-Mcnc: 1 g/dL (ref 0.7–1.3)
Gamma Glob SerPl Elph-Mcnc: 0.9 g/dL (ref 0.4–1.8)
Globulin, Total: 2.8 g/dL (ref 2.2–3.9)
IgA/Immunoglobulin A, Serum: 360 mg/dL (ref 64–422)
IgG (Immunoglobin G), Serum: 915 mg/dL (ref 586–1602)
IgM (Immunoglobulin M), Srm: 49 mg/dL (ref 26–217)
Total Protein: 6.7 g/dL (ref 6.0–8.5)

## 2022-06-06 LAB — TSH: TSH: 2.27 u[IU]/mL (ref 0.450–4.500)

## 2022-06-06 LAB — HGB A1C W/O EAG: Hgb A1c MFr Bld: 5.2 % (ref 4.8–5.6)

## 2022-06-06 LAB — CK: Total CK: 61 U/L (ref 32–182)

## 2022-06-06 LAB — SEDIMENTATION RATE: Sed Rate: 2 mm/hr (ref 0–40)

## 2022-06-06 LAB — ANA W/REFLEX IF POSITIVE: Anti Nuclear Antibody (ANA): NEGATIVE

## 2022-06-06 LAB — VITAMIN B12: Vitamin B-12: >2000 pg/mL — ABNORMAL HIGH (ref 232–1245)

## 2022-06-06 LAB — C-REACTIVE PROTEIN: CRP: <1 mg/L (ref 0–10)

## 2022-06-06 LAB — VITAMIN D 25 HYDROXY (VIT D DEFICIENCY, FRACTURES): Vit D, 25-Hydroxy: 95.9 ng/mL (ref 30.0–100.0)

## 2022-06-06 LAB — RPR: RPR Ser Ql: NONREACTIVE

## 2022-06-15 DIAGNOSIS — Z23 Encounter for immunization: Secondary | ICD-10-CM | POA: Diagnosis not present

## 2022-06-30 DIAGNOSIS — I10 Essential (primary) hypertension: Secondary | ICD-10-CM | POA: Diagnosis not present

## 2022-06-30 DIAGNOSIS — R69 Illness, unspecified: Secondary | ICD-10-CM | POA: Diagnosis not present

## 2022-06-30 DIAGNOSIS — E782 Mixed hyperlipidemia: Secondary | ICD-10-CM | POA: Diagnosis not present

## 2022-07-25 DIAGNOSIS — E7800 Pure hypercholesterolemia, unspecified: Secondary | ICD-10-CM | POA: Diagnosis not present

## 2022-07-25 DIAGNOSIS — R079 Chest pain, unspecified: Secondary | ICD-10-CM | POA: Diagnosis not present

## 2022-07-25 DIAGNOSIS — I491 Atrial premature depolarization: Secondary | ICD-10-CM | POA: Diagnosis not present

## 2022-07-25 DIAGNOSIS — Z7985 Long-term (current) use of injectable non-insulin antidiabetic drugs: Secondary | ICD-10-CM | POA: Diagnosis not present

## 2022-07-25 DIAGNOSIS — I700 Atherosclerosis of aorta: Secondary | ICD-10-CM | POA: Diagnosis not present

## 2022-07-25 DIAGNOSIS — R9431 Abnormal electrocardiogram [ECG] [EKG]: Secondary | ICD-10-CM | POA: Diagnosis not present

## 2022-07-25 DIAGNOSIS — K7581 Nonalcoholic steatohepatitis (NASH): Secondary | ICD-10-CM | POA: Diagnosis not present

## 2022-07-25 DIAGNOSIS — R918 Other nonspecific abnormal finding of lung field: Secondary | ICD-10-CM | POA: Diagnosis not present

## 2022-07-25 DIAGNOSIS — Z79899 Other long term (current) drug therapy: Secondary | ICD-10-CM | POA: Diagnosis not present

## 2022-07-25 DIAGNOSIS — E119 Type 2 diabetes mellitus without complications: Secondary | ICD-10-CM | POA: Diagnosis not present

## 2022-07-25 DIAGNOSIS — J439 Emphysema, unspecified: Secondary | ICD-10-CM | POA: Diagnosis not present

## 2022-07-25 DIAGNOSIS — K2289 Other specified disease of esophagus: Secondary | ICD-10-CM | POA: Diagnosis not present

## 2022-07-25 DIAGNOSIS — F17210 Nicotine dependence, cigarettes, uncomplicated: Secondary | ICD-10-CM | POA: Diagnosis not present

## 2022-07-25 DIAGNOSIS — R091 Pleurisy: Secondary | ICD-10-CM | POA: Diagnosis not present

## 2022-07-25 DIAGNOSIS — I2510 Atherosclerotic heart disease of native coronary artery without angina pectoris: Secondary | ICD-10-CM | POA: Diagnosis not present

## 2022-07-25 DIAGNOSIS — R0781 Pleurodynia: Secondary | ICD-10-CM | POA: Diagnosis not present

## 2022-07-25 DIAGNOSIS — R053 Chronic cough: Secondary | ICD-10-CM | POA: Diagnosis not present

## 2022-07-25 DIAGNOSIS — R911 Solitary pulmonary nodule: Secondary | ICD-10-CM | POA: Diagnosis not present

## 2022-08-12 DIAGNOSIS — R69 Illness, unspecified: Secondary | ICD-10-CM | POA: Diagnosis not present

## 2022-08-12 DIAGNOSIS — M7660 Achilles tendinitis, unspecified leg: Secondary | ICD-10-CM | POA: Diagnosis not present

## 2022-08-12 DIAGNOSIS — R030 Elevated blood-pressure reading, without diagnosis of hypertension: Secondary | ICD-10-CM | POA: Diagnosis not present

## 2022-08-12 DIAGNOSIS — Z6824 Body mass index (BMI) 24.0-24.9, adult: Secondary | ICD-10-CM | POA: Diagnosis not present

## 2022-08-15 DIAGNOSIS — H524 Presbyopia: Secondary | ICD-10-CM | POA: Diagnosis not present

## 2022-08-15 DIAGNOSIS — E119 Type 2 diabetes mellitus without complications: Secondary | ICD-10-CM | POA: Diagnosis not present

## 2022-08-15 DIAGNOSIS — H52223 Regular astigmatism, bilateral: Secondary | ICD-10-CM | POA: Diagnosis not present

## 2022-08-15 DIAGNOSIS — Z961 Presence of intraocular lens: Secondary | ICD-10-CM | POA: Diagnosis not present

## 2022-08-15 DIAGNOSIS — Z7984 Long term (current) use of oral hypoglycemic drugs: Secondary | ICD-10-CM | POA: Diagnosis not present

## 2022-08-17 DIAGNOSIS — Z23 Encounter for immunization: Secondary | ICD-10-CM | POA: Diagnosis not present

## 2022-09-26 IMAGING — CT CT CHEST LUNG CANCER SCREENING LOW DOSE W/O CM
2 of 4 series · 15 of 36 positions shown, 18 images · non-contrast
Comparison: None.

CLINICAL DATA: Current smoker with 50 pack-year history



[Series 9: lungs · axial · 0.73mm/px · z∈[+1196,+1475]mm · 12 of 307 slices shown, 15 images]
[im 14/307  mediastinal]
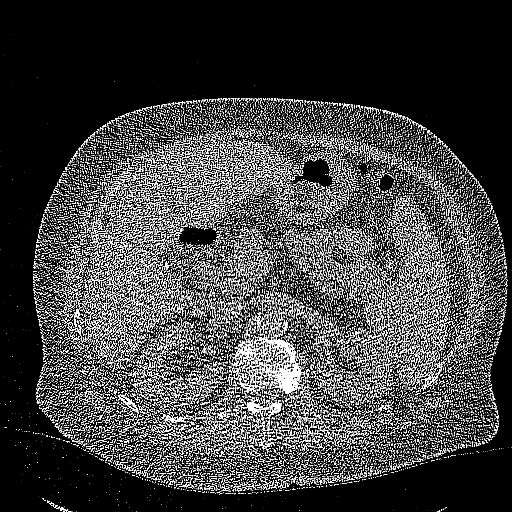
[im 14/307  lung]
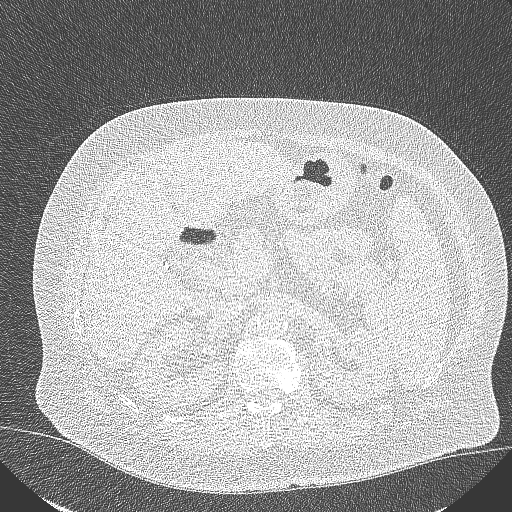
[im 42/307  lung]
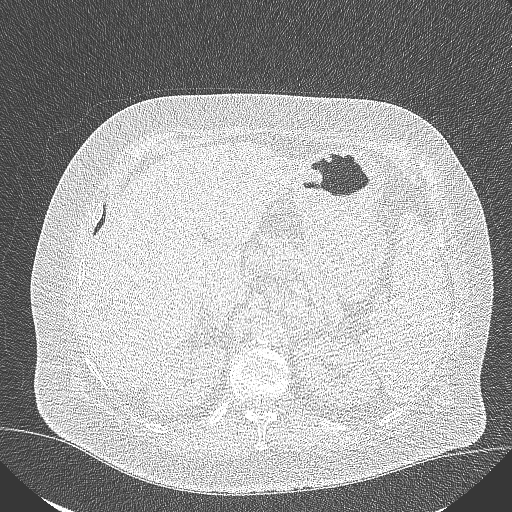
[im 70/307  lung]
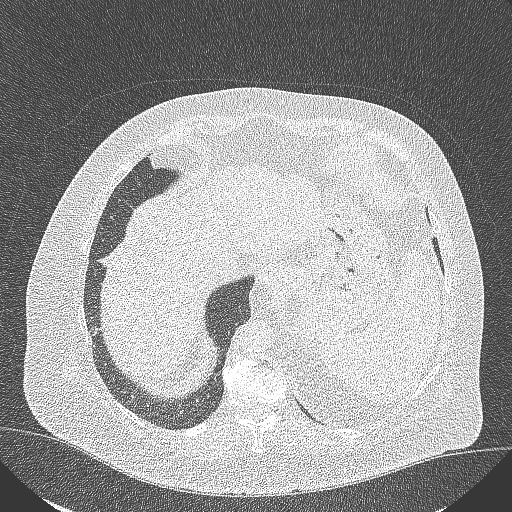
[im 98/307  lung]
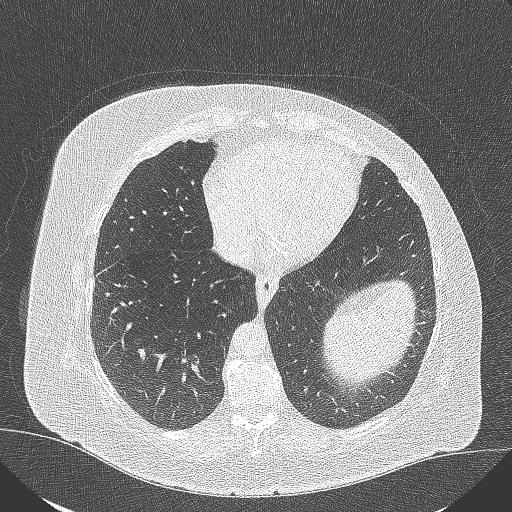
[im 112/307  mediastinal]
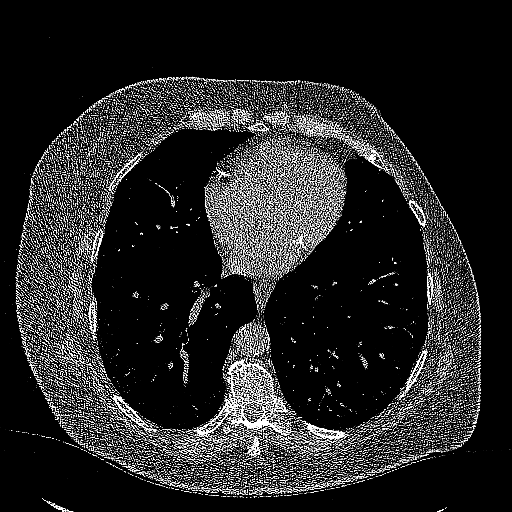
[im 112/307  lung]
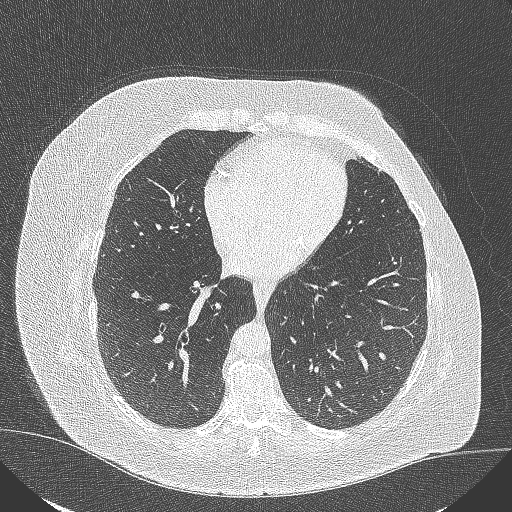
[im 140/307  lung]
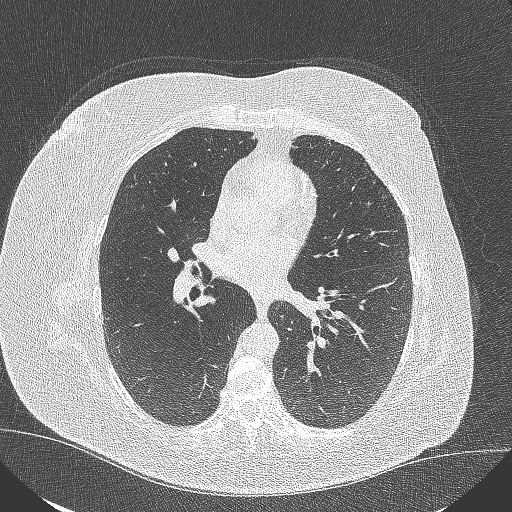
[im 167/307  lung]
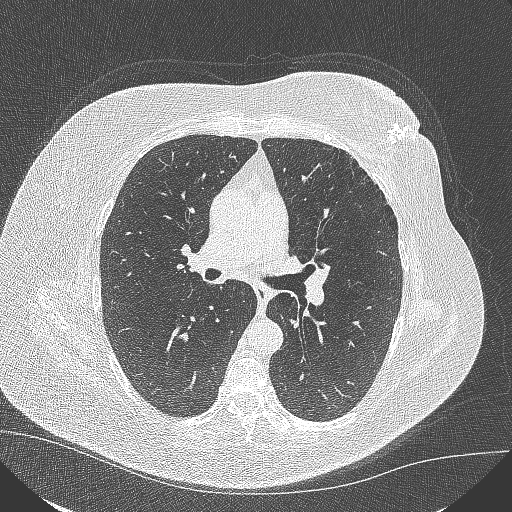
[im 195/307  lung]
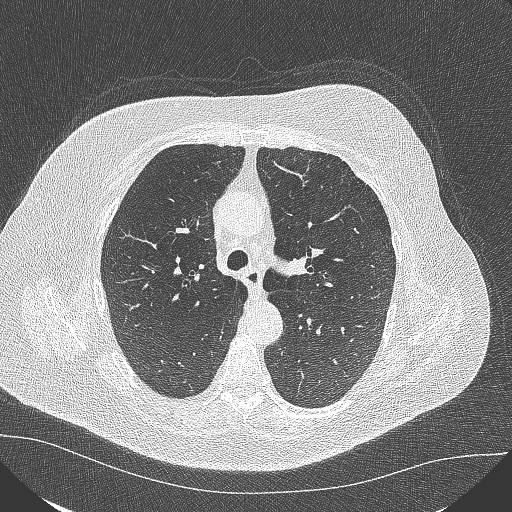
[im 209/307  mediastinal]
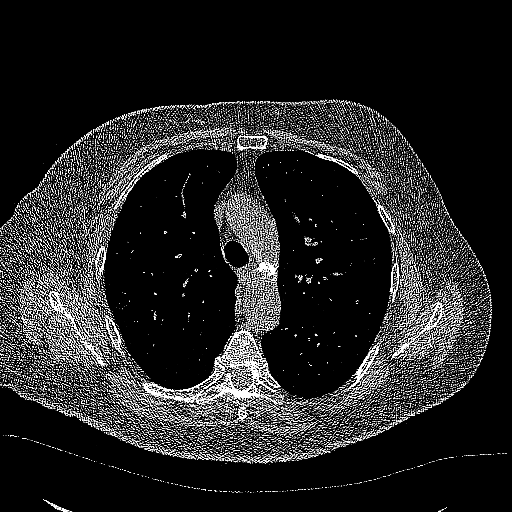
[im 209/307  lung]
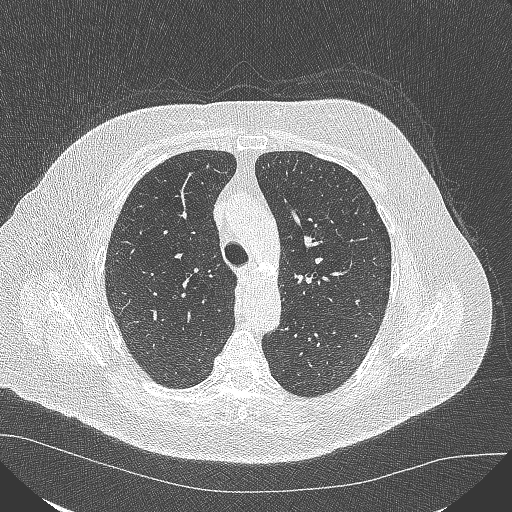
[im 237/307  lung]
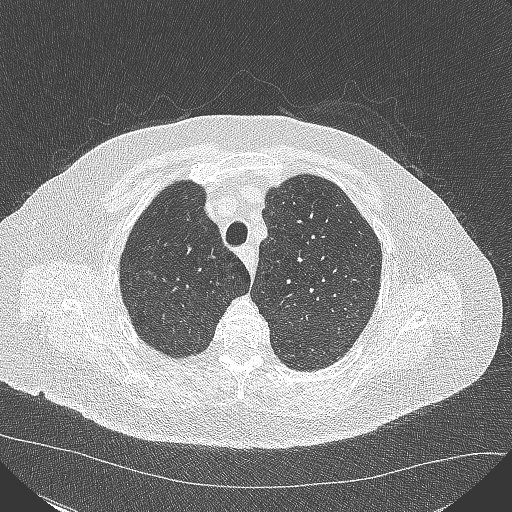
[im 265/307  lung]
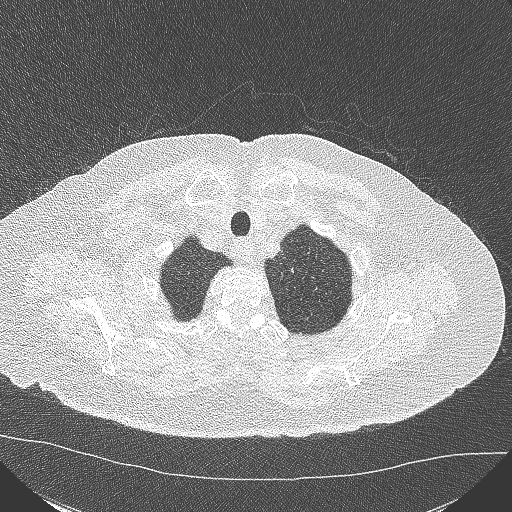
[im 293/307  lung]
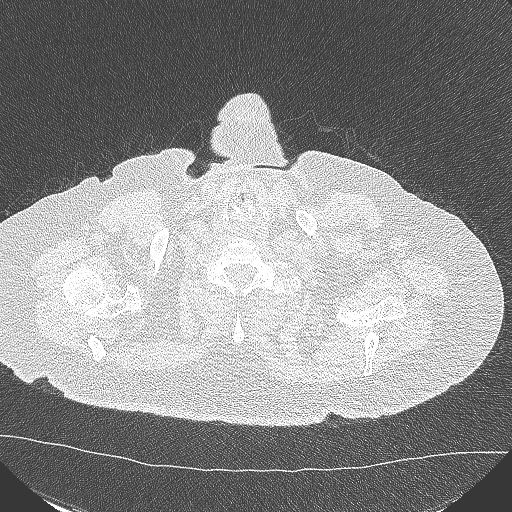

[Series 10: coronal · coronal · 0.64mm/px · 3 of 332 slices shown]
[im 67/332  lung]
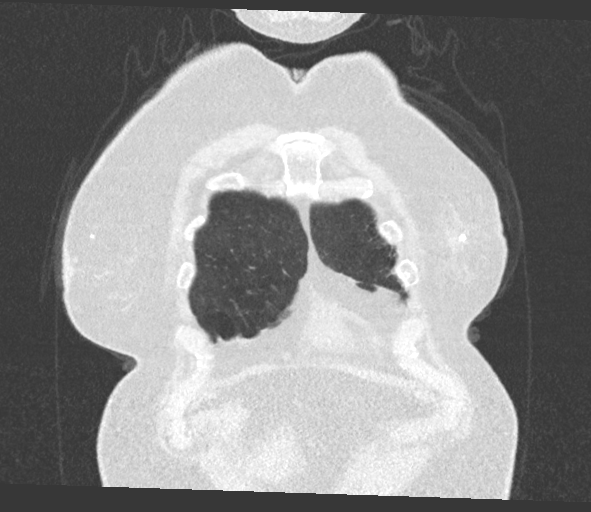
[im 133/332  lung]
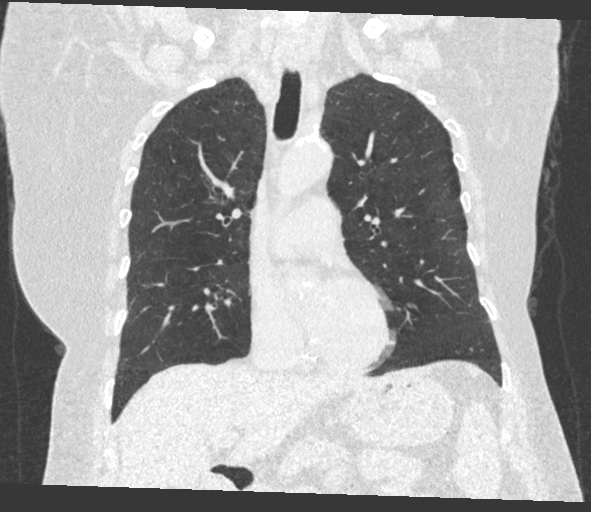
[im 199/332  lung]
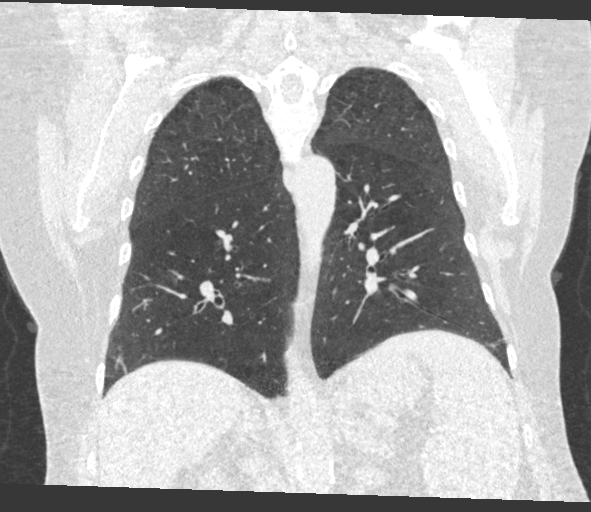

[15 of 36 positions shown; findings below may reference images not displayed]

FINDINGS: Cardiovascular: Normal heart size. No pericardial effusion.
Three-vessel coronary artery calcifications. Atherosclerotic disease
of the thoracic aorta.

Mediastinum/Nodes: Esophagus and thyroid are unremarkable. No
pathologically enlarged lymph nodes seen in the chest. Postsurgical
changes of the left breast.

Lungs/Pleura: Central airways are patent. No consolidation, pleural
effusion or pneumothorax. Centrilobular and paraseptal emphysema.
Numerous previously seen solid pulmonary nodules are resolved or
decreased in size. Additional previously seen solid pulmonary
nodules are stable. Largest solid nodule is located in the left
upper lobe measures 4.9 mm in mean diameter on image 47.

Upper Abdomen: Cholecystectomy clips.  No acute abnormality.

Musculoskeletal: No chest wall mass or suspicious bone lesions
identified.
IMPRESSION: 1. Lung-RADS 2, benign appearance or behavior. Continue annual
screening with low-dose chest CT without contrast in 12 months.
2. Coronary artery calcifications, aortic Atherosclerosis
(ZRK4O-IIN.N) and Emphysema (ZRK4O-O41.9).

## 2022-10-03 DIAGNOSIS — R030 Elevated blood-pressure reading, without diagnosis of hypertension: Secondary | ICD-10-CM | POA: Diagnosis not present

## 2022-10-03 DIAGNOSIS — R519 Headache, unspecified: Secondary | ICD-10-CM | POA: Diagnosis not present

## 2022-10-03 DIAGNOSIS — R059 Cough, unspecified: Secondary | ICD-10-CM | POA: Diagnosis not present

## 2022-10-03 DIAGNOSIS — Z20828 Contact with and (suspected) exposure to other viral communicable diseases: Secondary | ICD-10-CM | POA: Diagnosis not present

## 2022-10-03 DIAGNOSIS — Z6825 Body mass index (BMI) 25.0-25.9, adult: Secondary | ICD-10-CM | POA: Diagnosis not present

## 2022-10-03 DIAGNOSIS — R69 Illness, unspecified: Secondary | ICD-10-CM | POA: Diagnosis not present

## 2022-10-26 DIAGNOSIS — E1165 Type 2 diabetes mellitus with hyperglycemia: Secondary | ICD-10-CM | POA: Diagnosis not present

## 2022-10-26 DIAGNOSIS — J441 Chronic obstructive pulmonary disease with (acute) exacerbation: Secondary | ICD-10-CM | POA: Diagnosis not present

## 2022-10-26 DIAGNOSIS — E1140 Type 2 diabetes mellitus with diabetic neuropathy, unspecified: Secondary | ICD-10-CM | POA: Diagnosis not present

## 2022-10-26 DIAGNOSIS — E1169 Type 2 diabetes mellitus with other specified complication: Secondary | ICD-10-CM | POA: Diagnosis not present

## 2022-11-01 ENCOUNTER — Other Ambulatory Visit (HOSPITAL_COMMUNITY): Payer: Self-pay | Admitting: Family Medicine

## 2022-11-01 DIAGNOSIS — M21372 Foot drop, left foot: Secondary | ICD-10-CM | POA: Diagnosis not present

## 2022-11-01 DIAGNOSIS — E785 Hyperlipidemia, unspecified: Secondary | ICD-10-CM | POA: Diagnosis not present

## 2022-11-01 DIAGNOSIS — Z6824 Body mass index (BMI) 24.0-24.9, adult: Secondary | ICD-10-CM | POA: Diagnosis not present

## 2022-11-01 DIAGNOSIS — J302 Other seasonal allergic rhinitis: Secondary | ICD-10-CM | POA: Diagnosis not present

## 2022-11-01 DIAGNOSIS — J449 Chronic obstructive pulmonary disease, unspecified: Secondary | ICD-10-CM | POA: Diagnosis not present

## 2022-11-01 DIAGNOSIS — F17210 Nicotine dependence, cigarettes, uncomplicated: Secondary | ICD-10-CM

## 2022-11-01 DIAGNOSIS — E7801 Familial hypercholesterolemia: Secondary | ICD-10-CM | POA: Diagnosis not present

## 2022-11-01 DIAGNOSIS — E1169 Type 2 diabetes mellitus with other specified complication: Secondary | ICD-10-CM | POA: Diagnosis not present

## 2022-11-01 DIAGNOSIS — R69 Illness, unspecified: Secondary | ICD-10-CM | POA: Diagnosis not present

## 2022-11-01 DIAGNOSIS — S8410XA Injury of peroneal nerve at lower leg level, unspecified leg, initial encounter: Secondary | ICD-10-CM | POA: Diagnosis not present

## 2022-11-01 DIAGNOSIS — R030 Elevated blood-pressure reading, without diagnosis of hypertension: Secondary | ICD-10-CM | POA: Diagnosis not present

## 2022-11-01 DIAGNOSIS — Z0001 Encounter for general adult medical examination with abnormal findings: Secondary | ICD-10-CM | POA: Diagnosis not present

## 2022-11-03 ENCOUNTER — Other Ambulatory Visit (HOSPITAL_COMMUNITY): Payer: Self-pay | Admitting: Internal Medicine

## 2022-11-03 DIAGNOSIS — M810 Age-related osteoporosis without current pathological fracture: Secondary | ICD-10-CM

## 2022-11-07 ENCOUNTER — Ambulatory Visit (HOSPITAL_COMMUNITY)
Admission: RE | Admit: 2022-11-07 | Discharge: 2022-11-07 | Disposition: A | Discharge status: Home / Self Care | Payer: Medicare HMO | Source: Ambulatory Visit | Attending: Internal Medicine | Admitting: Internal Medicine

## 2022-11-07 DIAGNOSIS — M810 Age-related osteoporosis without current pathological fracture: Secondary | ICD-10-CM | POA: Insufficient documentation

## 2022-11-09 DIAGNOSIS — D696 Thrombocytopenia, unspecified: Secondary | ICD-10-CM | POA: Diagnosis not present

## 2022-11-09 DIAGNOSIS — R911 Solitary pulmonary nodule: Secondary | ICD-10-CM | POA: Diagnosis not present

## 2022-11-09 DIAGNOSIS — Z720 Tobacco use: Secondary | ICD-10-CM | POA: Diagnosis not present

## 2022-11-09 DIAGNOSIS — K7581 Nonalcoholic steatohepatitis (NASH): Secondary | ICD-10-CM | POA: Diagnosis not present

## 2022-11-09 DIAGNOSIS — K229 Disease of esophagus, unspecified: Secondary | ICD-10-CM | POA: Diagnosis not present

## 2022-11-14 ENCOUNTER — Encounter (INDEPENDENT_AMBULATORY_CARE_PROVIDER_SITE_OTHER): Payer: Self-pay | Admitting: *Deleted

## 2022-11-16 ENCOUNTER — Ambulatory Visit (HOSPITAL_COMMUNITY): Payer: Medicare HMO

## 2022-11-17 ENCOUNTER — Ambulatory Visit (INDEPENDENT_AMBULATORY_CARE_PROVIDER_SITE_OTHER): Payer: Medicare HMO | Admitting: Gastroenterology

## 2022-11-17 ENCOUNTER — Encounter (INDEPENDENT_AMBULATORY_CARE_PROVIDER_SITE_OTHER): Payer: Self-pay | Admitting: Gastroenterology

## 2022-11-17 VITALS — BP 128/54 | HR 73 | Temp 97.5°F | Ht 63.0 in | Wt 137.8 lb

## 2022-11-17 DIAGNOSIS — R933 Abnormal findings on diagnostic imaging of other parts of digestive tract: Secondary | ICD-10-CM | POA: Insufficient documentation

## 2022-11-17 DIAGNOSIS — R1310 Dysphagia, unspecified: Secondary | ICD-10-CM | POA: Diagnosis not present

## 2022-11-17 NOTE — Patient Instructions (Signed)
Continue with famotidine 20mg  each evening We will get you scheduled for upper endoscopy for further evaluation  We will see you at you scheduled follow up in October  It was a pleasure to see you today. I want to create trusting relationships with patients and provide genuine, compassionate, and quality care. I truly value your feedback! please be on the lookout for a survey regarding your visit with me today. I appreciate your input about our visit and your time in completing this!    Maday Guarino L. Jeanmarie Hubert, MSN, APRN, AGNP-C Adult-Gerontology Nurse Practitioner [facility] at [facility]

## 2022-11-17 NOTE — H&P (View-Only) (Signed)
 Referring Provider: Williams, Gordon F, MD Primary Care Physician:  Williams, Gordon F, MD Primary GI Physician: Castaneda   Chief Complaint  Patient presents with   Dysphagia    Patient here today with issues at times with swallowing. She says the cancer center [facility] told the patient she needed an endoscopy, they say there was an abnormality on the scan done on 07/25/2022 at [facility].   HPI:   [name] is a 72 y.o. female with past medical history of anxiety, breast cancer, COPD, DM, neuromuscular disorder.   Patient presenting today for acute visit with dysphagia.   Recent CTA chest with contrast in December 2023 Moderate circumferential wall thickening in the lower thoracic esophagus, most commonly due to reflux esophagitis.   Present: Patient reports issues with dysphagia on occasion, she notes more issues with pills vs foods though it can occur with foods if she eats too fast, with liquids if she sometimes feels like it "bubbles" back up in her throat. She denies any weight loss. Appetite is good. Denies early satiety. Denies rectal bleeding or melena. She has had some weight loss doing ozempic. No changes in bowel habits. Having a normal BM daily. She is taking famotidine 20mg QHS and feels she had resolution of her GERD symptoms. She reports that her doctor that ordered the CT scan recommended she see GI for possible endoscopy.    Last Colonoscopy:09/24/20 - Hemorrhoids found on perianal exam.  - Diverticulosis in the sigmoid colon.  - Tortuous colon.  - Non-bleeding internal hemorrhoids.   - No specimens collected.  Last Endoscopy:never   Past Medical History:  Diagnosis Date   Angio-edema    Anxiety    Cancer    breast cancer   COPD (chronic obstructive pulmonary disease)    Diabetes mellitus    Emphysema/COPD    History of shingles 06/2011   Neuromuscular disorder    Shortness of breath     Past Surgical History:  Procedure Laterality Date    ABDOMINAL HYSTERECTOMY  1990   BREAST SURGERY  1998 and 2000   CARPAL TUNNEL RELEASE Left 02/13/2018   Procedure: LEFT CARPAL TUNNEL RELEASE;  Surgeon: Kuzma, Gary, MD;  Location: [facility];  Service: Orthopedics;  Laterality: Left;   CATARACT EXTRACTION W/PHACO Right 09/10/2013   Procedure: CATARACT EXTRACTION PHACO AND INTRAOCULAR LENS PLACEMENT (IOC);  Surgeon: Mark T. Shapiro, MD;  [facility];  Service: Ophthalmology;  Laterality: Right;  CDE:12.81   CHOLECYSTECTOMY  2000   CYST REMOVAL HAND Right    right wrist   FASCIECTOMY Left 02/13/2018   Procedure: FASCIECTOMY LEFT RING;  Surgeon: Kuzma, Gary, MD;  Location: [facility];  Service: Orthopedics;  Laterality: Left;  REG/AXILLARY BLOCK   FRACTURE SURGERY  1994   HERNIA REPAIR  2007   MASS EXCISION  08/11/2011   Procedure: EXCISION MASS;  Surgeon: Robert V Sypher Jr., MD;  Location: [facility];  Service: Orthopedics;  Laterality: Right;  Synovectomy of extnsor tendon right long finger   Rotator cuff Right    TUBAL LIGATION  1974   ULNAR NERVE TRANSPOSITION Left 02/13/2018   Procedure: DECOMPRESSION LEFT ULNA NERVE;  Surgeon: Kuzma, Gary, MD;  [facility];  Service: Orthopedics;  Laterality: Left;    Current Outpatient Medications  Medication Sig Dispense Refill   albuterol (ACCUNEB) 0.63 MG/3ML nebulizer solution Take 1 ampule by nebulization every 6 (six) hours as needed for wheezing.       calcium carbonate (OS-CAL) 600 MG TABS tablet Take 1,200 mg by mouth daily.     celecoxib (CELEBREX) 200 MG capsule Take 200 mg by mouth daily.     cholecalciferol (VITAMIN D) 400 UNITS TABS tablet Take 400 Units by mouth daily.     cyanocobalamin 1000 MCG tablet Take 1,000 mcg by mouth daily.     famotidine (PEPCID) 20 MG tablet Take 1 tablet (20 mg total) by mouth at bedtime as needed for heartburn or indigestion. 60 tablet 3   fish oil-omega-3 fatty acids 1000 MG capsule Take  2 g by mouth daily.       montelukast (SINGULAIR) 10 MG tablet Take 1 tablet (10 mg total) by mouth at bedtime. 30 tablet 5   omeprazole (PRILOSEC) 20 MG capsule Take 20 mg by mouth daily.     pravastatin (PRAVACHOL) 40 MG tablet Take 40 mg by mouth daily.     Semaglutide, 1 MG/DOSE, (OZEMPIC, 1 MG/DOSE,) 2 MG/1.5ML SOPN Inject 1 mg into the skin once a week.     umeclidinium-vilanterol (ANORO ELLIPTA) 62.5-25 MCG/ACT AEPB Inhale 1 puff into the lungs daily.     No current facility-administered medications for this visit.    Allergies as of 11/17/2022 - Review Complete 11/17/2022  Allergen Reaction Noted   Vicodin [hydrocodone-acetaminophen] Itching 08/08/2011    Family History  Problem Relation Age of Onset   Bowel Disease Mother    Hypertension Mother    Psoriasis Mother    Heart attack Father    Hypertension Father    Heart disease Brother    Aneurysm Brother     Social History   Socioeconomic History   Marital status: Widowed    Spouse name: Not on file   Number of children: Not on file   Years of education: Not on file   Highest education level: Not on file  Occupational History   Not on file  Tobacco Use   Smoking status: Every Day    Packs/day: 1.00    Years: 50.00    Additional pack years: 0.00    Total pack years: 50.00    Types: Cigarettes   Smokeless tobacco: Never   Tobacco comments:     trying to quit  Vaping Use   Vaping Use: Former  Substance and Sexual Activity   Alcohol use: Yes    Comment: occasionally   Drug use: No   Sexual activity: Yes    Birth control/protection: Post-menopausal  Other Topics Concern   Not on file  Social History Narrative   Not on file   Social Determinants of Health   Financial Resource Strain: Not on file  Food Insecurity: Not on file  Transportation Needs: Not on file  Physical Activity: Not on file  Stress: Not on file  Social Connections: Not on file    Review of systems General: negative for malaise,  night sweats, fever, chills, weight loss Neck: Negative for lumps, goiter, pain and significant neck swelling Resp: Negative for cough, wheezing, dyspnea at rest CV: Negative for chest pain, leg swelling, palpitations, orthopnea GI: denies melena, hematochezia, nausea, vomiting, diarrhea, constipation, odyonophagia, early satiety or unintentional weight loss. +dysphagia  MSK: Negative for joint pain or swelling, back pain, and muscle pain. Derm: Negative for itching or rash Psych: Denies depression, anxiety, memory loss, confusion. No homicidal or suicidal ideation.  Heme: Negative for prolonged bleeding, bruising easily, and swollen nodes. Endocrine: Negative for cold or heat intolerance, polyuria, polydipsia and goiter. Neuro: negative for tremor,   gait imbalance, syncope and seizures. The remainder of the review of systems is noncontributory.  Physical Exam: BP (!) 128/54 (BP Location: Left Arm, Patient Position: Sitting, Cuff Size: Normal)   Pulse 73   Temp (!) 97.5 F (36.4 C) (Temporal)   Ht 5' 3" (1.6 m)   Wt 137 lb 12.8 oz (62.5 kg)   BMI 24.41 kg/m  General:   Alert and oriented. No distress noted. Pleasant and cooperative.  Head:  Normocephalic and atraumatic. Eyes:  Conjuctiva clear without scleral icterus. Mouth:  Oral mucosa pink and moist. Good dentition. No lesions. Heart: Normal rate and rhythm, s1 and s2 heart sounds present.  Lungs: Clear lung sounds in all lobes. Respirations equal and unlabored. Abdomen:  +BS, soft, non-tender and non-distended. No rebound or guarding. No HSM or masses noted. Derm: No palmar erythema or jaundice Msk:  Symmetrical without gross deformities. Normal posture. Extremities:  Without edema. Neurologic:  Alert and  oriented x4 Psych:  Alert and cooperative. Normal mood and affect.  Invalid input(s): "6 MONTHS"   ASSESSMENT: [name] is a 71 y.o. female presenting today for dysphagia.  Patient with dysphagia for the past few  months, worse with pills but still sometimes with food if eating too fast. CT imaging in December 2023 with Moderate circumferential wall thickening in the lower thoracic esophagus. Given abnormal CT of esophagus and dysphagia, would recommend proceeding with EGD for further evaluation as I cannot rule out esophagitis, or less likely, malignancy. Indications, risks and benefits of procedure discussed in detail with patient. Patient verbalized understanding and is in agreement to proceed with EGD +/- dilation.   PLAN:  Schedule EGD +/- dilation ASA III 2. Continue with famotidine 20mg QHS   All questions were answered, patient verbalized understanding and is in agreement with plan as outlined above.    Follow Up: Has f/u in October   Shantal Roan L. Elouise Divelbiss, MSN, APRN, AGNP-C Adult-Gerontology Nurse Practitioner [facility]  I have reviewed the note and agree with the APP's assessment as described in this progress note  Daniel Castaneda, MD Gastroenterology and Hepatology [facility]  

## 2022-11-17 NOTE — Progress Notes (Addendum)
Referring Provider: Donetta Potts, MD Primary Care Physician:  Donetta Potts, MD Primary GI Physician: Levon Hedger   Chief Complaint  Patient presents with   Dysphagia    Patient here today with issues at times with swallowing. She says the cancer center [facility] told the patient she needed an endoscopy, they say there was an abnormality on the scan done on 07/25/2022 at [facility].   HPI:   [name] is a 72 y.o. female with past medical history of anxiety, breast cancer, COPD, DM, neuromuscular disorder.   Patient presenting today for acute visit with dysphagia.   Recent CTA chest with contrast in December 2023 Moderate circumferential wall thickening in the lower thoracic esophagus, most commonly due to reflux esophagitis.   Present: Patient reports issues with dysphagia on occasion, she notes more issues with pills vs foods though it can occur with foods if she eats too fast, with liquids if she sometimes feels like it "bubbles" back up in her throat. She denies any weight loss. Appetite is good. Denies early satiety. Denies rectal bleeding or melena. She has had some weight loss doing ozempic. No changes in bowel habits. Having a normal BM daily. She is taking famotidine 20mg  QHS and feels she had resolution of her GERD symptoms. She reports that her doctor that ordered the CT scan recommended she see GI for possible endoscopy.    Last Colonoscopy:09/24/20 - Hemorrhoids found on perianal exam.  - Diverticulosis in the sigmoid colon.  - Tortuous colon.  - Non-bleeding internal hemorrhoids.   - No specimens collected.  Last Endoscopy:never   Past Medical History:  Diagnosis Date   Angio-edema    Anxiety    Cancer    breast cancer   COPD (chronic obstructive pulmonary disease)    Diabetes mellitus    Emphysema/COPD    History of shingles 06/2011   Neuromuscular disorder    Shortness of breath     Past Surgical History:  Procedure Laterality Date    ABDOMINAL HYSTERECTOMY  1990   BREAST SURGERY  1998 and 2000   CARPAL TUNNEL RELEASE Left 02/13/2018   Procedure: LEFT CARPAL TUNNEL RELEASE;  Surgeon: Cindee Salt, MD;  Location: [facility];  Service: Orthopedics;  Laterality: Left;   CATARACT EXTRACTION W/PHACO Right 09/10/2013   Procedure: CATARACT EXTRACTION PHACO AND INTRAOCULAR LENS PLACEMENT (IOC);  Surgeon: Loraine Leriche T. Nile Riggs, MD;  Location: [facility];  Service: Ophthalmology;  Laterality: Right;  CDE:12.81   CHOLECYSTECTOMY  2000   CYST REMOVAL HAND Right    right wrist   FASCIECTOMY Left 02/13/2018   Procedure: FASCIECTOMY LEFT RING;  Surgeon: Cindee Salt, MD;  Location: [facility];  Service: Orthopedics;  Laterality: Left;  REG/AXILLARY BLOCK   FRACTURE SURGERY  1994   HERNIA REPAIR  2007   MASS EXCISION  08/11/2011   Procedure: EXCISION MASS;  Surgeon: Wyn Forster., MD;  Location: [facility];  Service: Orthopedics;  Laterality: Right;  Synovectomy of extnsor tendon right long finger   Rotator cuff Right    TUBAL LIGATION  1974   ULNAR NERVE TRANSPOSITION Left 02/13/2018   Procedure: DECOMPRESSION LEFT ULNA NERVE;  Surgeon: Cindee Salt, MD;  Location:  SURGERY CENTER;  Service: Orthopedics;  Laterality: Left;    Current Outpatient Medications  Medication Sig Dispense Refill   albuterol (ACCUNEB) 0.63 MG/3ML nebulizer solution Take 1 ampule by nebulization every 6 (six) hours as needed for wheezing.  calcium carbonate (OS-CAL) 600 MG TABS tablet Take 1,200 mg by mouth daily.     celecoxib (CELEBREX) 200 MG capsule Take 200 mg by mouth daily.     cholecalciferol (VITAMIN D) 400 UNITS TABS tablet Take 400 Units by mouth daily.     cyanocobalamin 1000 MCG tablet Take 1,000 mcg by mouth daily.     famotidine (PEPCID) 20 MG tablet Take 1 tablet (20 mg total) by mouth at bedtime as needed for heartburn or indigestion. 60 tablet 3   fish oil-omega-3 fatty acids 1000 MG capsule Take  2 g by mouth daily.       montelukast (SINGULAIR) 10 MG tablet Take 1 tablet (10 mg total) by mouth at bedtime. 30 tablet 5   omeprazole (PRILOSEC) 20 MG capsule Take 20 mg by mouth daily.     pravastatin (PRAVACHOL) 40 MG tablet Take 40 mg by mouth daily.     Semaglutide, 1 MG/DOSE, (OZEMPIC, 1 MG/DOSE,) 2 MG/1.5ML SOPN Inject 1 mg into the skin once a week.     umeclidinium-vilanterol (ANORO ELLIPTA) 62.5-25 MCG/ACT AEPB Inhale 1 puff into the lungs daily.     No current facility-administered medications for this visit.    Allergies as of 11/17/2022 - Review Complete 11/17/2022  Allergen Reaction Noted   Vicodin [hydrocodone-acetaminophen] Itching 08/08/2011    Family History  Problem Relation Age of Onset   Bowel Disease Mother    Hypertension Mother    Psoriasis Mother    Heart attack Father    Hypertension Father    Heart disease Brother    Aneurysm Brother     Social History   Socioeconomic History   Marital status: Widowed    Spouse name: Not on file   Number of children: Not on file   Years of education: Not on file   Highest education level: Not on file  Occupational History   Not on file  Tobacco Use   Smoking status: Every Day    Packs/day: 1.00    Years: 50.00    Additional pack years: 0.00    Total pack years: 50.00    Types: Cigarettes   Smokeless tobacco: Never   Tobacco comments:     trying to quit  Vaping Use   Vaping Use: Former  Substance and Sexual Activity   Alcohol use: Yes    Comment: occasionally   Drug use: No   Sexual activity: Yes    Birth control/protection: Post-menopausal  Other Topics Concern   Not on file  Social History Narrative   Not on file   Social Determinants of Health   Financial Resource Strain: Not on file  Food Insecurity: Not on file  Transportation Needs: Not on file  Physical Activity: Not on file  Stress: Not on file  Social Connections: Not on file    Review of systems General: negative for malaise,  night sweats, fever, chills, weight loss Neck: Negative for lumps, goiter, pain and significant neck swelling Resp: Negative for cough, wheezing, dyspnea at rest CV: Negative for chest pain, leg swelling, palpitations, orthopnea GI: denies melena, hematochezia, nausea, vomiting, diarrhea, constipation, odyonophagia, early satiety or unintentional weight loss. +dysphagia  MSK: Negative for joint pain or swelling, back pain, and muscle pain. Derm: Negative for itching or rash Psych: Denies depression, anxiety, memory loss, confusion. No homicidal or suicidal ideation.  Heme: Negative for prolonged bleeding, bruising easily, and swollen nodes. Endocrine: Negative for cold or heat intolerance, polyuria, polydipsia and goiter. Neuro: negative for tremor,  gait imbalance, syncope and seizures. The remainder of the review of systems is noncontributory.  Physical Exam: BP (!) 128/54 (BP Location: Left Arm, Patient Position: Sitting, Cuff Size: Normal)   Pulse 73   Temp (!) 97.5 F (36.4 C) (Temporal)   Ht  (1.6 m)   Wt 137 lb 12.8 oz (62.5 kg)   BMI 24.41 kg/m  General:   Alert and oriented. No distress noted. Pleasant and cooperative.  Head:  Normocephalic and atraumatic. Eyes:  Conjuctiva clear without scleral icterus. Mouth:  Oral mucosa pink and moist. Good dentition. No lesions. Heart: Normal rate and rhythm, s1 and s2 heart sounds present.  Lungs: Clear lung sounds in all lobes. Respirations equal and unlabored. Abdomen:  +BS, soft, non-tender and non-distended. No rebound or guarding. No HSM or masses noted. Derm: No palmar erythema or jaundice Msk:  Symmetrical without gross deformities. Normal posture. Extremities:  Without edema. Neurologic:  Alert and  oriented x4 Psych:  Alert and cooperative. Normal mood and affect.  Invalid input(s): "6 MONTHS"   ASSESSMENT: [name] is a 72 y.o. female presenting today for dysphagia.  Patient with dysphagia for the past few  months, worse with pills but still sometimes with food if eating too fast. CT imaging in December 2023 with Moderate circumferential wall thickening in the lower thoracic esophagus. Given abnormal CT of esophagus and dysphagia, would recommend proceeding with EGD for further evaluation as I cannot rule out esophagitis, or less likely, malignancy. Indications, risks and benefits of procedure discussed in detail with patient. Patient verbalized understanding and is in agreement to proceed with EGD +/- dilation.   PLAN:  Schedule EGD +/- dilation ASA III 2. Continue with famotidine  QHS   All questions were answered, patient verbalized understanding and is in agreement with plan as outlined above.    Follow Up: Has f/u in October   Iwalani Templeton L. Jeanmarie Hubert, MSN, APRN, AGNP-C Adult-Gerontology Nurse Practitioner [facility]  I have reviewed the note and agree with the APP's assessment as described in this progress note  Katrinka Blazing, MD Gastroenterology and Hepatology [facility]

## 2022-11-23 DIAGNOSIS — F17200 Nicotine dependence, unspecified, uncomplicated: Secondary | ICD-10-CM | POA: Diagnosis not present

## 2022-11-23 DIAGNOSIS — L663 Perifolliculitis capitis abscedens: Secondary | ICD-10-CM | POA: Diagnosis not present

## 2022-11-23 DIAGNOSIS — R030 Elevated blood-pressure reading, without diagnosis of hypertension: Secondary | ICD-10-CM | POA: Diagnosis not present

## 2022-11-23 DIAGNOSIS — Z6825 Body mass index (BMI) 25.0-25.9, adult: Secondary | ICD-10-CM | POA: Diagnosis not present

## 2022-11-23 DIAGNOSIS — J302 Other seasonal allergic rhinitis: Secondary | ICD-10-CM | POA: Diagnosis not present

## 2022-11-23 DIAGNOSIS — E785 Hyperlipidemia, unspecified: Secondary | ICD-10-CM | POA: Diagnosis not present

## 2022-11-23 DIAGNOSIS — L309 Dermatitis, unspecified: Secondary | ICD-10-CM | POA: Diagnosis not present

## 2022-11-23 DIAGNOSIS — L0100 Impetigo, unspecified: Secondary | ICD-10-CM | POA: Diagnosis not present

## 2022-11-23 DIAGNOSIS — B355 Tinea imbricata: Secondary | ICD-10-CM | POA: Diagnosis not present

## 2022-11-23 DIAGNOSIS — E7801 Familial hypercholesterolemia: Secondary | ICD-10-CM | POA: Diagnosis not present

## 2022-11-23 DIAGNOSIS — M21372 Foot drop, left foot: Secondary | ICD-10-CM | POA: Diagnosis not present

## 2022-11-23 DIAGNOSIS — E1169 Type 2 diabetes mellitus with other specified complication: Secondary | ICD-10-CM | POA: Diagnosis not present

## 2022-11-23 DIAGNOSIS — J449 Chronic obstructive pulmonary disease, unspecified: Secondary | ICD-10-CM | POA: Diagnosis not present

## 2022-11-23 DIAGNOSIS — M810 Age-related osteoporosis without current pathological fracture: Secondary | ICD-10-CM | POA: Diagnosis not present

## 2022-11-23 DIAGNOSIS — S8410XA Injury of peroneal nerve at lower leg level, unspecified leg, initial encounter: Secondary | ICD-10-CM | POA: Diagnosis not present

## 2022-11-28 ENCOUNTER — Other Ambulatory Visit: Payer: Self-pay

## 2022-11-28 DIAGNOSIS — M810 Age-related osteoporosis without current pathological fracture: Secondary | ICD-10-CM

## 2022-11-30 ENCOUNTER — Telehealth (INDEPENDENT_AMBULATORY_CARE_PROVIDER_SITE_OTHER): Payer: Self-pay | Admitting: Gastroenterology

## 2022-11-30 NOTE — Telephone Encounter (Signed)
Pt contacted and made aware of pre op appt.  

## 2022-12-01 ENCOUNTER — Telehealth: Payer: Self-pay | Admitting: Pharmacy Technician

## 2022-12-01 NOTE — Telephone Encounter (Signed)
Auth Submission: NO AUTH NEEDED Site of care: Site of care: [facility] Payer: [employer] Medication & CPT/J Code(s) submitted: Reclast (Zolendronic acid) [id number] Route of submission (phone, fax, portal):  Phone # Fax # Auth type: Buy/Bill Units/visits requested: 1 Reference number:  Approval from: 12/01/22 to 08/01/23

## 2022-12-02 ENCOUNTER — Encounter (HOSPITAL_COMMUNITY)
Admission: RE | Admit: 2022-12-02 | Discharge: 2022-12-02 | Disposition: A | Discharge status: Home / Self Care | Payer: Medicare HMO | Source: Ambulatory Visit | Attending: Internal Medicine | Admitting: Internal Medicine

## 2022-12-02 VITALS — BP 123/46 | HR 67 | Temp 97.8°F | Resp 18

## 2022-12-02 DIAGNOSIS — S8410XA Injury of peroneal nerve at lower leg level, unspecified leg, initial encounter: Secondary | ICD-10-CM | POA: Insufficient documentation

## 2022-12-02 DIAGNOSIS — Y939 Activity, unspecified: Secondary | ICD-10-CM | POA: Diagnosis not present

## 2022-12-02 DIAGNOSIS — E1169 Type 2 diabetes mellitus with other specified complication: Secondary | ICD-10-CM | POA: Diagnosis not present

## 2022-12-02 DIAGNOSIS — J449 Chronic obstructive pulmonary disease, unspecified: Secondary | ICD-10-CM | POA: Diagnosis not present

## 2022-12-02 DIAGNOSIS — F17200 Nicotine dependence, unspecified, uncomplicated: Secondary | ICD-10-CM | POA: Insufficient documentation

## 2022-12-02 DIAGNOSIS — E7800 Pure hypercholesterolemia, unspecified: Secondary | ICD-10-CM | POA: Diagnosis not present

## 2022-12-02 DIAGNOSIS — Y929 Unspecified place or not applicable: Secondary | ICD-10-CM | POA: Diagnosis not present

## 2022-12-02 DIAGNOSIS — J302 Other seasonal allergic rhinitis: Secondary | ICD-10-CM | POA: Diagnosis not present

## 2022-12-02 DIAGNOSIS — M810 Age-related osteoporosis without current pathological fracture: Secondary | ICD-10-CM | POA: Insufficient documentation

## 2022-12-02 DIAGNOSIS — M21372 Foot drop, left foot: Secondary | ICD-10-CM | POA: Insufficient documentation

## 2022-12-02 MED ORDER — ZOLEDRONIC ACID 5 MG/100ML IV SOLN
5.0000 mg | Freq: Once | INTRAVENOUS | Status: AC
  Administered 2022-12-02: 5 mg via INTRAVENOUS
  Filled 2022-12-02: qty 100

## 2022-12-02 MED ORDER — DIPHENHYDRAMINE HCL 25 MG PO CAPS
25.0000 mg | ORAL_CAPSULE | Freq: Once | ORAL | Status: AC
  Administered 2022-12-02: 25 mg via ORAL
  Filled 2022-12-02: qty 1

## 2022-12-02 MED ORDER — ACETAMINOPHEN 325 MG PO TABS
650.0000 mg | ORAL_TABLET | Freq: Once | ORAL | Status: AC
  Administered 2022-12-02: 650 mg via ORAL
  Filled 2022-12-02: qty 2

## 2022-12-02 NOTE — Progress Notes (Signed)
Diagnosis: Osteoporosis  Provider:   Mitzi Hansen MD  Procedure: IV Infusion  IV Type: Peripheral, IV Location: R Forearm  Reclast (Zolendronic Acid), Dose: 5 mg  Infusion Start Time: 1141  Infusion Stop Time: 1213  Post Infusion IV Care: Observation period completed and Peripheral IV Discontinued  Discharge: Condition: Good, Destination: Home . AVS Provided  Performed by:  Marin Shutter, RN

## 2022-12-08 NOTE — Patient Instructions (Signed)
[name]  12/08/2022     @PREFPERIOPPHARMACY @   Your procedure is scheduled on  12/15/2022.   [facility] to [facility] at  0830  A.M.   Call this number if you have problems the morning of surgery:  [phone]  If you experience any cold or flu symptoms such as cough, fever, chills, shortness of breath, etc. between now and your scheduled surgery, please notify us at the above number.   Remember:  Follow the diet instructions given to you by the office.      Your last dose of Ozempic should be on 12/07/2022 or before.     DO NOT take any medications for diabetes the morning of your procedure.     Use your nebulizer and your inhaler before your come and bring your rescue inhaler with you.     Take these medicines the morning of surgery with A SIP OF WATER                       zyrtec, allegra, omeprazole.     Do not wear jewelry, make-up or nail polish.  Do not wear lotions, powders, or perfumes, or deodorant.  Do not shave 48 hours prior to surgery.  Men may shave face and neck.  Do not bring valuables to the hospital.  [facility] is not responsible for any belongings or valuables.  Contacts, dentures or bridgework may not be worn into surgery.  Leave your suitcase in the car.  After surgery it may be brought to your room.  For patients admitted to the hospital, discharge time will be determined by your treatment team.  Patients discharged the day of surgery will not be allowed to drive home and must have someone with them for 24 hours.    Special instructions:   DO NOT smoke tobacco or vape for 24 hours before your procedure.  Please read over the following fact sheets that you were given. Anesthesia Post-op Instructions and Care and Recovery After Surgery        Upper Endoscopy, Adult, Care After After the procedure, it is common to have a sore throat. It is also common to have: Mild stomach pain or discomfort. Bloating. Nausea. Follow these  instructions at home: The instructions below may help you care for yourself at home. Your health care provider may give you more instructions. If you have questions, ask your health care provider. If you were given a sedative during the procedure, it can affect you for several hours. Do not drive or operate machinery until your health care provider says that it is safe. If you will be going home right after the procedure, plan to have a responsible adult: Take you home from the hospital or clinic. You will not be allowed to drive. Care for you for the time you are told. Follow instructions from your health care provider about what you may eat and drink. Return to your normal activities as told by your health care provider. Ask your health care provider what activities are safe for you. Take over-the-counter and prescription medicines only as told by your health care provider. Contact a health care provider if you: Have a sore throat that lasts longer than one day. Have trouble swallowing. Have a fever. Get help right away if you: Vomit blood or your vomit looks like coffee grounds. Have bloody, black, or tarry stools. Have a very bad sore throat or you cannot swallow.  Have difficulty breathing or very bad pain in your chest or abdomen. These symptoms may be an emergency. Get help right away. Call 911. Do not wait to see if the symptoms will go away. Do not drive yourself to the hospital. Summary After the procedure, it is common to have a sore throat, mild stomach discomfort, bloating, and nausea. If you were given a sedative during the procedure, it can affect you for several hours. Do not drive until your health care provider says that it is safe. Follow instructions from your health care provider about what you may eat and drink. Return to your normal activities as told by your health care provider. This information is not intended to replace advice given to you by your health care  provider. Make sure you discuss any questions you have with your health care provider. Document Revised: 10/27/2021 Document Reviewed: 10/27/2021 Elsevier Patient Education  2023 Elsevier Inc. Esophageal Dilatation Esophageal dilatation, also called esophageal dilation, is a procedure to widen or open a blocked or narrowed part of the esophagus. The esophagus is the part of the body that moves food and liquid from the mouth to the stomach. You may need this procedure if: You have a buildup of scar tissue in your esophagus that makes it difficult, painful, or impossible to swallow. This can be caused by gastroesophageal reflux disease (GERD). You have cancer of the esophagus. There is a problem with how food moves through your esophagus. In some cases, you may need this procedure repeated at a later time to dilate the esophagus gradually. Tell a health care provider about: Any allergies you have. All medicines you are taking, including vitamins, herbs, eye drops, creams, and over-the-counter medicines. Any problems you or family members have had with anesthetic medicines. Any blood disorders you have. Any surgeries you have had. Any medical conditions you have. Any antibiotic medicines you are required to take before dental procedures. Whether you are pregnant or may be pregnant. What are the risks? Generally, this is a safe procedure. However, problems may occur, including: Bleeding due to a tear in the lining of the esophagus. A hole, or perforation, in the esophagus. What happens before the procedure? Ask your health care provider about: Changing or stopping your regular medicines. This is especially important if you are taking diabetes medicines or blood thinners. Taking medicines such as aspirin and ibuprofen. These medicines can thin your blood. Do not take these medicines unless your health care provider tells you to take them. Taking over-the-counter medicines, vitamins, herbs, and  supplements. Follow instructions from your health care provider about eating or drinking restrictions. Plan to have a responsible adult take you home from the hospital or clinic. Plan to have a responsible adult care for you for the time you are told after you leave the hospital or clinic. This is important. What happens during the procedure? You may be given a medicine to help you relax (sedative). A numbing medicine may be sprayed into the back of your throat, or you may gargle the medicine. Your health care provider may perform the dilatation using various surgical instruments, such as: Simple dilators. This instrument is carefully placed in the esophagus to stretch it. Guided wire bougies. This involves using an endoscope to insert a wire into the esophagus. A dilator is passed over this wire to enlarge the esophagus. Then the wire is removed. Balloon dilators. An endoscope with a small balloon is inserted into the esophagus. The balloon is inflated to stretch the  esophagus and open it up. The procedure may vary among health care providers and hospitals. What can I expect after the procedure? Your blood pressure, heart rate, breathing rate, and blood oxygen level will be monitored until you leave the hospital or clinic. Your throat may feel slightly sore and numb. This will get better over time. You will not be allowed to eat or drink until your throat is no longer numb. When you are able to drink, urinate, and sit on the edge of the bed without nausea or dizziness, you may be able to return home. Follow these instructions at home: Take over-the-counter and prescription medicines only as told by your health care provider. If you were given a sedative during the procedure, it can affect you for several hours. Do not drive or operate machinery until your health care provider says that it is safe. Plan to have a responsible adult care for you for the time you are told. This is important. Follow  instructions from your health care provider about any eating or drinking restrictions. Do not use any products that contain nicotine or tobacco, such as cigarettes, e-cigarettes, and chewing tobacco. If you need help quitting, ask your health care provider. Keep all follow-up visits. This is important. Contact a health care provider if: You have a fever. You have pain that is not relieved by medicine. Get help right away if: You have chest pain. You have trouble breathing. You have trouble swallowing. You vomit blood. You have black, tarry, or bloody stools. These symptoms may represent a serious problem that is an emergency. Do not wait to see if the symptoms will go away. Get medical help right away. Call your local emergency services (911 in the [place] Do not drive yourself to the hospital. Summary Esophageal dilatation, also called esophageal dilation, is a procedure to widen or open a blocked or narrowed part of the esophagus. Plan to have a responsible adult take you home from the hospital or clinic. For this procedure, a numbing medicine may be sprayed into the back of your throat, or you may gargle the medicine. Do not drive or operate machinery until your health care provider says that it is safe. This information is not intended to replace advice given to you by your health care provider. Make sure you discuss any questions you have with your health care provider. Document Revised: 12/04/2019 Document Reviewed: 12/04/2019 Elsevier Patient Education  2023 Elsevier Inc. Monitored Anesthesia Care, Care After The following information offers guidance on how to care for yourself after your procedure. Your health care provider may also give you more specific instructions. If you have problems or questions, contact your health care provider. What can I expect after the procedure? After the procedure, it is common to have: Tiredness. Little or no memory about what happened during or  after the procedure. Impaired judgment when it comes to making decisions. Nausea or vomiting. Some trouble with balance. Follow these instructions at home: For the time period you were told by your health care provider:  Rest. Do not participate in activities where you could fall or become injured. Do not drive or use machinery. Do not drink alcohol. Do not take sleeping pills or medicines that cause drowsiness. Do not make important decisions or sign legal documents. Do not take care of children on your own. Medicines Take over-the-counter and prescription medicines only as told by your health care provider. If you were prescribed antibiotics, take them as told by your health care  provider. Do not stop using the antibiotic even if you start to feel better. Eating and drinking Follow instructions from your health care provider about what you may eat and drink. Drink enough fluid to keep your urine pale yellow. If you vomit: Drink clear fluids slowly and in small amounts as you are able. Clear fluids include water, ice chips, low-calorie sports drinks, and fruit juice that has water added to it (diluted fruit juice). Eat light and bland foods in small amounts as you are able. These foods include bananas, applesauce, rice, lean meats, toast, and crackers. General instructions  Have a responsible adult stay with you for the time you are told. It is important to have someone help care for you until you are awake and alert. If you have sleep apnea, surgery and some medicines can increase your risk for breathing problems. Follow instructions from your health care provider about wearing your sleep device: When you are sleeping. This includes during daytime naps. While taking prescription pain medicines, sleeping medicines, or medicines that make you drowsy. Do not use any products that contain nicotine or tobacco. These products include cigarettes, chewing tobacco, and vaping devices, such as  e-cigarettes. If you need help quitting, ask your health care provider. Contact a health care provider if: You feel nauseous or vomit every time you eat or drink. You feel light-headed. You are still sleepy or having trouble with balance after 24 hours. You get a rash. You have a fever. You have redness or swelling around the IV site. Get help right away if: You have trouble breathing. You have new confusion after you get home. These symptoms may be an emergency. Get help right away. Call 911. Do not wait to see if the symptoms will go away. Do not drive yourself to the hospital. This information is not intended to replace advice given to you by your health care provider. Make sure you discuss any questions you have with your health care provider. Document Revised: 12/13/2021 Document Reviewed: 12/13/2021 Elsevier Patient Education  2023 [employer].

## 2022-12-12 ENCOUNTER — Encounter (HOSPITAL_COMMUNITY)
Admission: RE | Admit: 2022-12-12 | Discharge: 2022-12-12 | Disposition: A | Discharge status: Home / Self Care | Payer: Medicare HMO | Source: Ambulatory Visit | Attending: Gastroenterology | Admitting: Gastroenterology

## 2022-12-12 DIAGNOSIS — K7581 Nonalcoholic steatohepatitis (NASH): Secondary | ICD-10-CM | POA: Diagnosis not present

## 2022-12-12 DIAGNOSIS — E1142 Type 2 diabetes mellitus with diabetic polyneuropathy: Secondary | ICD-10-CM | POA: Insufficient documentation

## 2022-12-12 DIAGNOSIS — Z01818 Encounter for other preprocedural examination: Secondary | ICD-10-CM | POA: Insufficient documentation

## 2022-12-12 LAB — CBC WITH DIFFERENTIAL/PLATELET
Abs Immature Granulocytes: 0.02 10*3/uL (ref 0.00–0.07)
Basophils Absolute: 0 10*3/uL (ref 0.0–0.1)
Basophils Relative: 1 %
Eosinophils Absolute: 0.1 10*3/uL (ref 0.0–0.5)
Eosinophils Relative: 1 %
HCT: 48.6 % — ABNORMAL HIGH (ref 36.0–46.0)
Hemoglobin: 15.8 g/dL — ABNORMAL HIGH (ref 12.0–15.0)
Immature Granulocytes: 0 %
Lymphocytes Relative: 26 %
Lymphs Abs: 1.3 10*3/uL (ref 0.7–4.0)
MCH: 31 pg (ref 26.0–34.0)
MCHC: 32.5 g/dL (ref 30.0–36.0)
MCV: 95.3 fL (ref 80.0–100.0)
Monocytes Absolute: 0.2 10*3/uL (ref 0.1–1.0)
Monocytes Relative: 5 %
Neutro Abs: 3.3 10*3/uL (ref 1.7–7.7)
Neutrophils Relative %: 67 %
Platelets: 115 10*3/uL — ABNORMAL LOW (ref 150–400)
RBC: 5.1 MIL/uL (ref 3.87–5.11)
RDW: 13.4 % (ref 11.5–15.5)
WBC: 5 10*3/uL (ref 4.0–10.5)
nRBC: 0 % (ref 0.0–0.2)

## 2022-12-12 LAB — COMPREHENSIVE METABOLIC PANEL
ALT: 32 U/L (ref 0–44)
AST: 27 U/L (ref 15–41)
Albumin: 4 g/dL (ref 3.5–5.0)
Alkaline Phosphatase: 104 U/L (ref 38–126)
Anion gap: 9 (ref 5–15)
BUN: 23 mg/dL (ref 8–23)
CO2: 27 mmol/L (ref 22–32)
Calcium: 9.7 mg/dL (ref 8.9–10.3)
Chloride: 104 mmol/L (ref 98–111)
Creatinine, Ser: 0.64 mg/dL (ref 0.44–1.00)
GFR, Estimated: >60 mL/min (ref >60)
Glucose, Bld: 143 mg/dL — ABNORMAL HIGH (ref 70–99)
Potassium: 4.8 mmol/L (ref 3.5–5.1)
Sodium: 140 mmol/L (ref 135–145)
Total Bilirubin: 0.6 mg/dL (ref 0.3–1.2)
Total Protein: 7.6 g/dL (ref 6.5–8.1)

## 2022-12-12 LAB — PROTIME-INR
INR: 1.1 (ref 0.8–1.2)
Prothrombin Time: 14.1 seconds (ref 11.4–15.2)

## 2022-12-13 ENCOUNTER — Ambulatory Visit: Payer: Medicare HMO | Admitting: Neurology

## 2022-12-15 ENCOUNTER — Ambulatory Visit (HOSPITAL_COMMUNITY)
Admission: RE | Admit: 2022-12-15 | Discharge: 2022-12-15 | Disposition: A | Discharge status: Home / Self Care | Payer: Medicare HMO | Attending: Gastroenterology | Admitting: Gastroenterology

## 2022-12-15 ENCOUNTER — Encounter (HOSPITAL_COMMUNITY)
Admission: RE | Disposition: A | Discharge status: Home / Self Care | Payer: Self-pay | Source: Home / Self Care | Attending: Gastroenterology

## 2022-12-15 ENCOUNTER — Ambulatory Visit (HOSPITAL_COMMUNITY): Payer: Medicare HMO | Admitting: Anesthesiology

## 2022-12-15 ENCOUNTER — Encounter (HOSPITAL_COMMUNITY): Payer: Self-pay | Admitting: Gastroenterology

## 2022-12-15 ENCOUNTER — Ambulatory Visit (HOSPITAL_BASED_OUTPATIENT_CLINIC_OR_DEPARTMENT_OTHER): Payer: Medicare HMO | Admitting: Anesthesiology

## 2022-12-15 DIAGNOSIS — J449 Chronic obstructive pulmonary disease, unspecified: Secondary | ICD-10-CM

## 2022-12-15 DIAGNOSIS — R1319 Other dysphagia: Secondary | ICD-10-CM

## 2022-12-15 DIAGNOSIS — Z7984 Long term (current) use of oral hypoglycemic drugs: Secondary | ICD-10-CM

## 2022-12-15 DIAGNOSIS — Z853 Personal history of malignant neoplasm of breast: Secondary | ICD-10-CM | POA: Insufficient documentation

## 2022-12-15 DIAGNOSIS — F17210 Nicotine dependence, cigarettes, uncomplicated: Secondary | ICD-10-CM

## 2022-12-15 DIAGNOSIS — Z08 Encounter for follow-up examination after completed treatment for malignant neoplasm: Secondary | ICD-10-CM | POA: Diagnosis not present

## 2022-12-15 DIAGNOSIS — F419 Anxiety disorder, unspecified: Secondary | ICD-10-CM | POA: Diagnosis not present

## 2022-12-15 DIAGNOSIS — K219 Gastro-esophageal reflux disease without esophagitis: Secondary | ICD-10-CM | POA: Diagnosis not present

## 2022-12-15 DIAGNOSIS — R1110 Vomiting, unspecified: Secondary | ICD-10-CM | POA: Diagnosis not present

## 2022-12-15 DIAGNOSIS — R933 Abnormal findings on diagnostic imaging of other parts of digestive tract: Secondary | ICD-10-CM | POA: Diagnosis not present

## 2022-12-15 DIAGNOSIS — F17200 Nicotine dependence, unspecified, uncomplicated: Secondary | ICD-10-CM | POA: Diagnosis not present

## 2022-12-15 DIAGNOSIS — E119 Type 2 diabetes mellitus without complications: Secondary | ICD-10-CM | POA: Diagnosis not present

## 2022-12-15 DIAGNOSIS — Z79899 Other long term (current) drug therapy: Secondary | ICD-10-CM | POA: Diagnosis not present

## 2022-12-15 DIAGNOSIS — K449 Diaphragmatic hernia without obstruction or gangrene: Secondary | ICD-10-CM

## 2022-12-15 DIAGNOSIS — R948 Abnormal results of function studies of other organs and systems: Secondary | ICD-10-CM | POA: Diagnosis not present

## 2022-12-15 DIAGNOSIS — R1310 Dysphagia, unspecified: Secondary | ICD-10-CM

## 2022-12-15 DIAGNOSIS — K5730 Diverticulosis of large intestine without perforation or abscess without bleeding: Secondary | ICD-10-CM | POA: Insufficient documentation

## 2022-12-15 HISTORY — PX: ESOPHAGOGASTRODUODENOSCOPY (EGD) WITH PROPOFOL: SHX5813

## 2022-12-15 HISTORY — PX: ESOPHAGEAL DILATION: SHX303

## 2022-12-15 LAB — GLUCOSE, CAPILLARY: Glucose-Capillary: 104 mg/dL — ABNORMAL HIGH (ref 70–99)

## 2022-12-15 SURGERY — ESOPHAGOGASTRODUODENOSCOPY (EGD) WITH PROPOFOL
Anesthesia: General

## 2022-12-15 MED ORDER — PROPOFOL 500 MG/50ML IV EMUL
INTRAVENOUS | Status: DC | PRN
  Administered 2022-12-15: 175 ug/kg/min via INTRAVENOUS

## 2022-12-15 MED ORDER — PROPOFOL 10 MG/ML IV BOLUS
INTRAVENOUS | Status: DC | PRN
  Administered 2022-12-15: 50 mg via INTRAVENOUS

## 2022-12-15 MED ORDER — LIDOCAINE 2% (20 MG/ML) 5 ML SYRINGE
INTRAMUSCULAR | Status: DC | PRN
  Administered 2022-12-15: 50 mg via INTRAVENOUS

## 2022-12-15 MED ORDER — LACTATED RINGERS IV SOLN
INTRAVENOUS | Status: DC
  Administered 2022-12-15: 1000 mL via INTRAVENOUS

## 2022-12-15 MED ORDER — STERILE WATER FOR IRRIGATION IR SOLN
Status: DC | PRN
  Administered 2022-12-15: .6 mL

## 2022-12-15 MED ORDER — PHENYLEPHRINE 80 MCG/ML (10ML) SYRINGE FOR IV PUSH (FOR BLOOD PRESSURE SUPPORT)
PREFILLED_SYRINGE | INTRAVENOUS | Status: DC | PRN
  Administered 2022-12-15: 80 ug via INTRAVENOUS

## 2022-12-15 NOTE — Transfer of Care (Signed)
Immediate Anesthesia Transfer of Care Note  Patient: [name]  Procedure(s) Performed: ESOPHAGOGASTRODUODENOSCOPY (EGD) WITH PROPOFOL ESOPHAGEAL DILATION  Patient Location: PACU  Anesthesia Type:General  Level of Consciousness: sedated  Airway & Oxygen Therapy: Patient Spontanous Breathing and Patient connected to nasal cannula oxygen  Post-op Assessment: Report given to RN and Post -op Vital signs reviewed and stable  Post vital signs: Reviewed and stable  Last Vitals:  Vitals Value Taken Time  BP 109/64 12/15/22 1012  Temp 36.5 C 12/15/22 1012  Pulse 88 12/15/22 1012  Resp 20 12/15/22 1012  SpO2 97 % 12/15/22 1012    Last Pain:  Vitals:   12/15/22 1012  TempSrc: Axillary  PainSc: 0-No pain      Patients Stated Pain Goal: 7 (12/15/22 1610)  Complications: No notable events documented.

## 2022-12-15 NOTE — Interval H&P Note (Signed)
History and Physical Interval Note:  12/15/2022 9:37 AM  [name]  has presented today for surgery, with the diagnosis of dysphagia, abnormal ct esophagus.  The various methods of treatment have been discussed with the patient and family. After consideration of risks, benefits and other options for treatment, the patient has consented to  Procedure(s) with comments: ESOPHAGOGASTRODUODENOSCOPY (EGD) WITH PROPOFOL (N/A) - 10:00am;ASA 3 ESOPHAGEAL DILATION (N/A) - 10:00am;ASA 3 as a surgical intervention.  The patient's history has been reviewed, patient examined, no change in status, stable for surgery.  I have reviewed the patient's chart and labs.  Questions were answered to the patient's satisfaction.     Katrinka Blazing [place]

## 2022-12-15 NOTE — Op Note (Addendum)
[facility] Patient Name: [name] Procedure Date: 12/15/2022 9:39 AM MRN: [id number] Date of Birth: 06-Oct-1950 Attending MD: Katrinka Blazing , , [id number] CSN: [id number] Age: 72 Admit Type: Outpatient Procedure:                Upper GI endoscopy Indications:              Dysphagia, Abnormal CT of the GI tract,                            Regurgitation Providers:                Katrinka Blazing, Jannett Celestine, RN, Lennice Sites                            Technician, Technician Referring MD:              Medicines:                Monitored Anesthesia Care Complications:            No immediate complications. Estimated Blood Loss:     Estimated blood loss: none. Procedure:                Pre-Anesthesia Assessment:                           - Prior to the procedure, a History and Physical                            was performed, and patient medications, allergies                            and sensitivities were reviewed. The patient's                            tolerance of previous anesthesia was reviewed.                           - The risks and benefits of the procedure and the                            sedation options and risks were discussed with the                            patient. All questions were answered and informed                            consent was obtained.                           - After reviewing the risks and benefits, the                            patient was deemed in satisfactory condition to                            undergo the procedure.                           -  ASA Grade Assessment: III - A patient with severe                            systemic disease.                           After obtaining informed consent, the endoscope was                            passed under direct vision. Throughout the                            procedure, the patient's blood pressure, pulse, and                            oxygen saturations were  monitored continuously. The                            GIF-H190 ([id number]) scope was introduced through the                            mouth, and advanced to the second part of duodenum.                            The upper GI endoscopy was accomplished without                            difficulty. The patient tolerated the procedure                            well. Scope In: 9:53:29 AM Scope Out: 10:03:47 AM Total Procedure Duration: 0 hours 10 minutes 18 seconds  Findings:      The base of the tongue had a purplish nodular discoloration. No samples       were taken given the possibility of a vascular component.      No endoscopic abnormality was evident in the esophagus to explain the       patient's complaint of dysphagia. It was decided, however, to proceed       with dilation of the entire esophagus. A guidewire was placed and the       scope was withdrawn. Dilation was performed with a Savary dilator with       no resistance at 18 mm. The dilation site was examined following       endoscope reinsertion and showed no change.      A 2 cm hiatal hernia was found.      The gastroesophageal flap valve was visualized endoscopically and       classified as Hill Grade III (minimal fold, loose to endoscope, hiatal       hernia likely).      The entire examined stomach was normal.      The examined duodenum was normal. Impression:               - The base of the tongue had a purplish nodular  discoloration                           - No endoscopic esophageal abnormality to explain                            patient's dysphagia. Esophagus dilated. Dilated.                           - 2 cm hiatal hernia.                           - Normal stomach.                           - Normal examined duodenum.                           - No specimens collected. Moderate Sedation:      Per Anesthesia Care Recommendation:           - Discharge patient to home (ambulatory).                            - Resume previous diet.                           - Await pathology results.                           - If recurrent regurgitation, will need to proceed                            with pH impedance testing and manometry. May                            consider cTIF if abnormal findings.                           - ENT referral for tongue abnormality evaluation. Procedure Code(s):        --- Professional ---                           [id number], Esophagogastroduodenoscopy, flexible,                            transoral; with insertion of guide wire followed by                            passage of dilator(s) through esophagus over guide                            wire Diagnosis Code(s):        --- Professional ---                           R13.10, Dysphagia, unspecified  K44.9, Diaphragmatic hernia without obstruction or                            gangrene                           R11.10, Vomiting, unspecified                           R93.3, Abnormal findings on diagnostic imaging of                            other parts of digestive tract CPT copyright 2022 [employer]. All rights reserved. The codes documented in this report are preliminary and upon coder review may  be revised to meet current compliance requirements. Katrinka Blazing, MD Katrinka Blazing,  12/15/2022 10:16:50 AM This report has been signed electronically. Number of Addenda: 0

## 2022-12-15 NOTE — Anesthesia Postprocedure Evaluation (Signed)
Anesthesia Post Note  Patient: [name]  Procedure(s) Performed: ESOPHAGOGASTRODUODENOSCOPY (EGD) WITH PROPOFOL ESOPHAGEAL DILATION  Patient location during evaluation: Phase II Anesthesia Type: General Level of consciousness: awake and alert and oriented Pain management: pain level controlled Vital Signs Assessment: post-procedure vital signs reviewed and stable Respiratory status: spontaneous breathing, nonlabored ventilation and respiratory function stable Cardiovascular status: blood pressure returned to baseline and stable Postop Assessment: no apparent nausea or vomiting Anesthetic complications: no  No notable events documented.   Last Vitals:  Vitals:   12/15/22 0938 12/15/22 1012  BP: (!) 146/60 109/64  Pulse:  88  Resp: 13 20  Temp: 36.6 C 36.5 C  SpO2: 100% 97%    Last Pain:  Vitals:   12/15/22 1012  TempSrc: Axillary  PainSc: 0-No pain                 Judith Campillo C Cherita Hebel

## 2022-12-15 NOTE — Discharge Instructions (Signed)
You are being discharged to home.  Resume your previous diet.  We are waiting for your pathology results. If recurrent regurgitation, will need to proceed with pH impedance testing and manometry.

## 2022-12-15 NOTE — Anesthesia Preprocedure Evaluation (Signed)
Anesthesia Evaluation  Patient identified by MRN, date of birth, ID band Patient awake    Reviewed: Allergy & Precautions, H&P , NPO status , Patient's Chart, lab work & pertinent test results  Airway Mallampati: II  TM Distance: >3 FB Neck ROM: Full    Dental  (+) Edentulous Upper, Edentulous Lower   Pulmonary neg pulmonary ROS, shortness of breath and with exertion, COPD,  COPD inhaler, Current Smoker and Patient abstained from smoking.   Pulmonary exam normal breath sounds clear to auscultation       Cardiovascular negative cardio ROS Normal cardiovascular exam Rhythm:Regular Rate:Normal     Neuro/Psych  PSYCHIATRIC DISORDERS Anxiety      Neuromuscular disease (left foot drop) negative neurological ROS  negative psych ROS   GI/Hepatic ,GERD  Medicated and Controlled,,(+) Hepatitis -  Endo/Other  diabetes, Well Controlled, Type 2, Oral Hypoglycemic Agents    Renal/GU negative Renal ROS  negative genitourinary   Musculoskeletal negative musculoskeletal ROS (+)    Abdominal   Peds negative pediatric ROS (+)  Hematology negative hematology ROS (+)   Anesthesia Other Findings   Reproductive/Obstetrics negative OB ROS                             Anesthesia Physical Anesthesia Plan  ASA: 3  Anesthesia Plan: General   Post-op Pain Management: Minimal or no pain anticipated   Induction: Intravenous  PONV Risk Score and Plan: Propofol infusion  Airway Management Planned: Nasal Cannula and Natural Airway  Additional Equipment:   Intra-op Plan:   Post-operative Plan:   Informed Consent: I have reviewed the patients History and Physical, chart, labs and discussed the procedure including the risks, benefits and alternatives for the proposed anesthesia with the patient or authorized representative who has indicated his/her understanding and acceptance.     Dental advisory  given  Plan Discussed with: CRNA and Surgeon  Anesthesia Plan Comments:         Anesthesia Quick Evaluation

## 2022-12-19 DIAGNOSIS — L663 Perifolliculitis capitis abscedens: Secondary | ICD-10-CM | POA: Diagnosis not present

## 2022-12-19 DIAGNOSIS — B353 Tinea pedis: Secondary | ICD-10-CM | POA: Diagnosis not present

## 2022-12-20 ENCOUNTER — Encounter (HOSPITAL_COMMUNITY): Payer: Self-pay | Admitting: Gastroenterology

## 2023-01-08 ENCOUNTER — Other Ambulatory Visit (INDEPENDENT_AMBULATORY_CARE_PROVIDER_SITE_OTHER): Payer: Self-pay | Admitting: Gastroenterology

## 2023-01-11 ENCOUNTER — Ambulatory Visit (HOSPITAL_COMMUNITY)
Admission: RE | Admit: 2023-01-11 | Discharge: 2023-01-11 | Disposition: A | Discharge status: Home / Self Care | Payer: Medicare HMO | Source: Ambulatory Visit | Attending: Family Medicine | Admitting: Family Medicine

## 2023-01-11 DIAGNOSIS — F17210 Nicotine dependence, cigarettes, uncomplicated: Secondary | ICD-10-CM | POA: Diagnosis not present

## 2023-02-07 ENCOUNTER — Encounter: Payer: Self-pay | Admitting: Internal Medicine

## 2023-02-07 NOTE — Progress Notes (Signed)
Updated medical history.

## 2023-02-08 ENCOUNTER — Encounter: Payer: Self-pay | Admitting: Internal Medicine

## 2023-02-08 ENCOUNTER — Encounter (HOSPITAL_COMMUNITY): Payer: Self-pay | Admitting: Internal Medicine

## 2023-02-08 ENCOUNTER — Encounter: Payer: Medicare HMO | Attending: Internal Medicine | Admitting: Internal Medicine

## 2023-02-08 VITALS — BP 120/64 | HR 85 | Ht 63.0 in | Wt 139.6 lb

## 2023-02-08 DIAGNOSIS — I2510 Atherosclerotic heart disease of native coronary artery without angina pectoris: Secondary | ICD-10-CM | POA: Diagnosis not present

## 2023-02-08 DIAGNOSIS — I2584 Coronary atherosclerosis due to calcified coronary lesion: Secondary | ICD-10-CM | POA: Insufficient documentation

## 2023-02-08 DIAGNOSIS — I359 Nonrheumatic aortic valve disorder, unspecified: Secondary | ICD-10-CM | POA: Insufficient documentation

## 2023-02-08 DIAGNOSIS — E785 Hyperlipidemia, unspecified: Secondary | ICD-10-CM | POA: Insufficient documentation

## 2023-02-08 NOTE — Progress Notes (Signed)
Cardiology Office Note  Date: 02/08/2023   ID: [name], DOB 1951-04-15, MRN [id number]  PCP:  Donetta Potts, MD  Cardiologist:  Marjo Bicker, MD Electrophysiologist:  None   Reason for Office Visit: Coronary calcification and aortic valve calcification   History of Present Illness: [name] is a 72 y.o. female known to have DM 2, COPD, HLD was referred to cardiology clinic for evaluation of coronary artery calcification and aortic valve calcification.  CT chest lung cancer screening from 6/24 showed calcified plaque in the left main, LAD and RCA as well as calcifications of the aortic valve and mitral annulus.  Patient denies any symptoms of DOE or angina.  She has baseline SOB but no recent worsening of DOE.  No orthopnea, PND or leg swelling.  She performs her daily activities and household chores with no symptoms.  No leg swelling.  She has cramps only at rest but it gets better with walking.  Bilateral ABIs performed in 2023 that were within normal limits, R ABI 0.96 and L ABI 0.87 with triphasic waveforms.  Past Medical History:  Diagnosis Date   Angio-edema    Anxiety    Cancer [facility])    breast cancer   COPD (chronic obstructive pulmonary disease) (HCC)    Diabetes mellitus    Emphysema/COPD (HCC)    History of shingles 06/2011   Hypercholesterolemia    Hyperlipidemia    Neuromuscular disorder (HCC)    Shortness of breath     Past Surgical History:  Procedure Laterality Date   ABDOMINAL HYSTERECTOMY  1990   BREAST SURGERY  1998 and 2000   CARPAL TUNNEL RELEASE Left 02/13/2018   Procedure: LEFT CARPAL TUNNEL RELEASE;  Surgeon: Cindee Salt, MD;  Location: [facility];  Service: Orthopedics;  Laterality: Left;   CATARACT EXTRACTION W/PHACO Right 09/10/2013   Procedure: CATARACT EXTRACTION PHACO AND INTRAOCULAR LENS PLACEMENT (IOC);  Surgeon: Loraine Leriche T. Nile Riggs, MD;  Location: [facility];  Service: Ophthalmology;  Laterality: Right;   CDE:12.81   CHOLECYSTECTOMY  2000   CYST REMOVAL HAND Right    right wrist   ESOPHAGEAL DILATION N/A 12/15/2022   Procedure: ESOPHAGEAL DILATION;  Surgeon: Dolores Frame, MD;  Location: [facility];  Service: Gastroenterology;  Laterality: N/A;  10:00am;ASA 3   ESOPHAGOGASTRODUODENOSCOPY (EGD) WITH PROPOFOL N/A 12/15/2022   Procedure: ESOPHAGOGASTRODUODENOSCOPY (EGD) WITH PROPOFOL;  Surgeon: Dolores Frame, MD;  Location: [facility];  Service: Gastroenterology;  Laterality: N/A;  10:00am;ASA 3   FASCIECTOMY Left 02/13/2018   Procedure: FASCIECTOMY LEFT RING;  Surgeon: Cindee Salt, MD;  Location: [facility];  Service: Orthopedics;  Laterality: Left;  REG/AXILLARY BLOCK   FRACTURE SURGERY  1994   HERNIA REPAIR  2007   MASS EXCISION  08/11/2011   Procedure: EXCISION MASS;  Surgeon: Wyn Forster., MD;  Location: [facility];  Service: Orthopedics;  Laterality: Right;  Synovectomy of extnsor tendon right long finger   Rotator cuff Right    TUBAL LIGATION  1974   ULNAR NERVE TRANSPOSITION Left 02/13/2018   Procedure: DECOMPRESSION LEFT ULNA NERVE;  Surgeon: Cindee Salt, MD;  Location: [facility];  Service: Orthopedics;  Laterality: Left;    Current Outpatient Medications  Medication Sig Dispense Refill   albuterol (PROVENTIL) (2.5 MG/3ML) 0.083% nebulizer solution Take 2.5 mg by nebulization every 4 (four) hours as needed.     albuterol (VENTOLIN HFA) 108 (90 Base) MCG/ACT inhaler Inhale  2 puffs into the lungs every 6 (six) hours as needed for wheezing or shortness of breath.     Calcium Carb-Cholecalciferol (CALCIUM 600+D) 600-20 MG-MCG TABS Take 1 tablet by mouth 2 (two) times daily.     celecoxib (CELEBREX) 200 MG capsule Take 200 mg by mouth daily.     cetirizine (ZYRTEC) 10 MG tablet Take 10 mg by mouth daily.     Cholecalciferol (VITAMIN D3) 250 MCG (10000 UT) capsule Take 10,000 Units by mouth daily.     clobetasol  (TEMOVATE) 0.05 % external solution Apply 1 Application topically 2 (two) times daily.     Cyanocobalamin (B-12) 2500 MCG TABS Take 2,500 mcg by mouth daily.     famotidine (PEPCID) 20 MG tablet TAKE ONE TABLET BY MOUTH EVERYDAY AT BEDTIME AS NEEDED FOR heartburn OR INDIGESTION 60 tablet 3   fexofenadine (ALLEGRA) 180 MG tablet Take 180 mg by mouth daily as needed (hives).     fluticasone (FLONASE) 50 MCG/ACT nasal spray Place 1 spray into both nostrils daily.     montelukast (SINGULAIR) 10 MG tablet Take 1 tablet (10 mg total) by mouth at bedtime. 30 tablet 5   Omega-3 Fatty Acids (FISH OIL) 1200 MG CAPS Take 1,200 mg by mouth daily.     omeprazole (PRILOSEC) 20 MG capsule Take 20 mg by mouth daily.     pravastatin (PRAVACHOL) 40 MG tablet Take 40 mg by mouth at bedtime.     Semaglutide, 1 MG/DOSE, (OZEMPIC, 1 MG/DOSE,) 2 MG/1.5ML SOPN Inject 1 mg into the skin every Monday.     umeclidinium-vilanterol (ANORO ELLIPTA) 62.5-25 MCG/ACT AEPB Inhale 1 puff into the lungs daily.     No current facility-administered medications for this visit.   Allergies:  Vicodin [hydrocodone-acetaminophen]   Social History: The patient  reports that she has been smoking cigarettes. She has a 50.00 pack-year smoking history. She has never used smokeless tobacco. She reports current alcohol use. She reports that she does not use drugs.   Family History: The patient's family history includes Aneurysm in her brother; Bowel Disease in her mother; Heart attack in her father; Heart disease in her brother; Hypertension in her father and mother; Psoriasis in her mother.   ROS:  Please see the history of present illness. Otherwise, complete review of systems is positive for none  All other systems are reviewed and negative.   Physical Exam: VS:  BP 120/64   Pulse 85   Ht 5\' 3"  (1.6 m)   Wt 139 lb 9.6 oz (63.3 kg)   SpO2 94%   BMI 24.73 kg/m , BMI Body mass index is 24.73 kg/m.  Wt Readings from Last 3 Encounters:   02/08/23 139 lb 9.6 oz (63.3 kg)  11/17/22 137 lb 12.8 oz (62.5 kg)  06/01/22 140 lb (63.5 kg)    General: Patient appears comfortable at rest. HEENT: Conjunctiva and lids normal, oropharynx clear with moist mucosa. Neck: Supple, no elevated JVP or carotid bruits, no thyromegaly. Lungs: Clear to auscultation, nonlabored breathing at rest. Cardiac: Regular rate and rhythm, no S3 or significant systolic murmur, no pericardial rub. Abdomen: Soft, nontender, no hepatomegaly, bowel sounds present, no guarding or rebound. Extremities: No pitting edema, distal pulses 2+. Skin: Warm and dry. Musculoskeletal: No kyphosis. Neuropsychiatric: Alert and oriented x3, affect grossly appropriate.  Recent Labwork: 06/01/2022: TSH 2.270 12/12/2022: ALT 32; AST 27; BUN 23; Creatinine, Ser 0.64; Hemoglobin 15.8; Platelets 115; Potassium 4.8; Sodium 140     Component Value Date/Time  CHOL 136 05/24/2022 0922   TRIG 172 (H) 05/24/2022 0922    Assessment and Plan:   # Coronary calcification # Aortic valve calcification -CT chest lung cancer screening from 6/24 showed calcified plaque in the left main, LAD and RCA as well as calcifications of the aortic valve and mitral annulus. Patient has no symptoms of angina or DOE.  She has baseline SOB due to COPD but no recent worsening of DOE. No orthopnea, PND or leg swelling.  No murmur on physical examination. No indication of today echocardiogram at this point. No indication of cardiac stress testing as she did not have any symptoms.  # Type 2 diabetes mellitus, on Ozempic, follows with PCP  # HLD: Continue pravastatin 40 mg nightly, goal LDL less than 100.  Follows with PCP.    I have spent a total of 30 minutes with patient reviewing chart, EKGs, labs and examining patient as well as establishing an assessment and plan that was discussed with the patient.  > 50% of time was spent in direct patient care.    Medication Adjustments/Labs and Tests  Ordered: Current medicines are reviewed at length with the patient today.  Concerns regarding medicines are outlined above.   Tests Ordered: No orders of the defined types were placed in this encounter.   Medication Changes: No orders of the defined types were placed in this encounter.   Disposition:  Follow up prn  Signed Katryn Plummer Verne Spurr, MD, 02/08/2023 9:03 AM    [facility] [address] [place], [place], [place] [address]

## 2023-02-08 NOTE — Patient Instructions (Addendum)
Medication Instructions:   Your physician recommends that you continue on your current medications as directed. Please refer to the Current Medication list given to you today.  Labwork:  none  Testing/Procedures:  none  Follow-Up:  Your physician recommends that you schedule a follow-up appointment in: as needed.  Any Other Special Instructions Will Be Listed Below (If Applicable).  If you need a refill on your cardiac medications before your next appointment, please call your pharmacy. 

## 2023-02-14 DIAGNOSIS — E1165 Type 2 diabetes mellitus with hyperglycemia: Secondary | ICD-10-CM | POA: Diagnosis not present

## 2023-02-14 DIAGNOSIS — E559 Vitamin D deficiency, unspecified: Secondary | ICD-10-CM | POA: Diagnosis not present

## 2023-02-14 DIAGNOSIS — E785 Hyperlipidemia, unspecified: Secondary | ICD-10-CM | POA: Diagnosis not present

## 2023-02-14 DIAGNOSIS — K219 Gastro-esophageal reflux disease without esophagitis: Secondary | ICD-10-CM | POA: Diagnosis not present

## 2023-02-15 ENCOUNTER — Other Ambulatory Visit: Payer: Self-pay

## 2023-02-20 DIAGNOSIS — J302 Other seasonal allergic rhinitis: Secondary | ICD-10-CM | POA: Diagnosis not present

## 2023-02-20 DIAGNOSIS — F17200 Nicotine dependence, unspecified, uncomplicated: Secondary | ICD-10-CM | POA: Diagnosis not present

## 2023-02-20 DIAGNOSIS — R030 Elevated blood-pressure reading, without diagnosis of hypertension: Secondary | ICD-10-CM | POA: Diagnosis not present

## 2023-02-20 DIAGNOSIS — Z6825 Body mass index (BMI) 25.0-25.9, adult: Secondary | ICD-10-CM | POA: Diagnosis not present

## 2023-02-20 DIAGNOSIS — E7801 Familial hypercholesterolemia: Secondary | ICD-10-CM | POA: Diagnosis not present

## 2023-02-20 DIAGNOSIS — E785 Hyperlipidemia, unspecified: Secondary | ICD-10-CM | POA: Diagnosis not present

## 2023-02-20 DIAGNOSIS — S8410XA Injury of peroneal nerve at lower leg level, unspecified leg, initial encounter: Secondary | ICD-10-CM | POA: Diagnosis not present

## 2023-02-20 DIAGNOSIS — J449 Chronic obstructive pulmonary disease, unspecified: Secondary | ICD-10-CM | POA: Diagnosis not present

## 2023-02-20 DIAGNOSIS — M810 Age-related osteoporosis without current pathological fracture: Secondary | ICD-10-CM | POA: Diagnosis not present

## 2023-02-20 DIAGNOSIS — M21372 Foot drop, left foot: Secondary | ICD-10-CM | POA: Diagnosis not present

## 2023-02-20 DIAGNOSIS — E1169 Type 2 diabetes mellitus with other specified complication: Secondary | ICD-10-CM | POA: Diagnosis not present

## 2023-03-14 DIAGNOSIS — Z809 Family history of malignant neoplasm, unspecified: Secondary | ICD-10-CM | POA: Diagnosis not present

## 2023-03-14 DIAGNOSIS — Z8249 Family history of ischemic heart disease and other diseases of the circulatory system: Secondary | ICD-10-CM | POA: Diagnosis not present

## 2023-03-14 DIAGNOSIS — E119 Type 2 diabetes mellitus without complications: Secondary | ICD-10-CM | POA: Diagnosis not present

## 2023-03-14 DIAGNOSIS — R32 Unspecified urinary incontinence: Secondary | ICD-10-CM | POA: Diagnosis not present

## 2023-03-14 DIAGNOSIS — J302 Other seasonal allergic rhinitis: Secondary | ICD-10-CM | POA: Diagnosis not present

## 2023-03-14 DIAGNOSIS — J439 Emphysema, unspecified: Secondary | ICD-10-CM | POA: Diagnosis not present

## 2023-03-14 DIAGNOSIS — Z008 Encounter for other general examination: Secondary | ICD-10-CM | POA: Diagnosis not present

## 2023-03-14 DIAGNOSIS — I10 Essential (primary) hypertension: Secondary | ICD-10-CM | POA: Diagnosis not present

## 2023-03-14 DIAGNOSIS — K219 Gastro-esophageal reflux disease without esophagitis: Secondary | ICD-10-CM | POA: Diagnosis not present

## 2023-03-14 DIAGNOSIS — Z791 Long term (current) use of non-steroidal anti-inflammatories (NSAID): Secondary | ICD-10-CM | POA: Diagnosis not present

## 2023-03-14 DIAGNOSIS — F325 Major depressive disorder, single episode, in full remission: Secondary | ICD-10-CM | POA: Diagnosis not present

## 2023-03-14 DIAGNOSIS — E785 Hyperlipidemia, unspecified: Secondary | ICD-10-CM | POA: Diagnosis not present

## 2023-03-14 DIAGNOSIS — M1990 Unspecified osteoarthritis, unspecified site: Secondary | ICD-10-CM | POA: Diagnosis not present

## 2023-03-15 DIAGNOSIS — R220 Localized swelling, mass and lump, head: Secondary | ICD-10-CM | POA: Diagnosis not present

## 2023-03-15 DIAGNOSIS — J342 Deviated nasal septum: Secondary | ICD-10-CM | POA: Diagnosis not present

## 2023-03-29 DIAGNOSIS — R9439 Abnormal result of other cardiovascular function study: Secondary | ICD-10-CM | POA: Diagnosis not present

## 2023-03-29 DIAGNOSIS — M79673 Pain in unspecified foot: Secondary | ICD-10-CM | POA: Diagnosis not present

## 2023-03-29 DIAGNOSIS — M79672 Pain in left foot: Secondary | ICD-10-CM | POA: Diagnosis not present

## 2023-03-29 DIAGNOSIS — I739 Peripheral vascular disease, unspecified: Secondary | ICD-10-CM | POA: Diagnosis not present

## 2023-03-29 DIAGNOSIS — M79671 Pain in right foot: Secondary | ICD-10-CM | POA: Diagnosis not present

## 2023-04-25 DIAGNOSIS — Z1231 Encounter for screening mammogram for malignant neoplasm of breast: Secondary | ICD-10-CM | POA: Diagnosis not present

## 2023-05-04 DIAGNOSIS — K7581 Nonalcoholic steatohepatitis (NASH): Secondary | ICD-10-CM | POA: Diagnosis not present

## 2023-05-04 DIAGNOSIS — D696 Thrombocytopenia, unspecified: Secondary | ICD-10-CM | POA: Diagnosis not present

## 2023-05-04 DIAGNOSIS — D751 Secondary polycythemia: Secondary | ICD-10-CM | POA: Diagnosis not present

## 2023-05-10 ENCOUNTER — Encounter: Payer: Self-pay | Admitting: Vascular Surgery

## 2023-05-10 ENCOUNTER — Ambulatory Visit: Payer: Medicare HMO | Admitting: Vascular Surgery

## 2023-05-10 VITALS — BP 139/61 | HR 74 | Temp 97.7°F | Resp 20 | Ht 63.0 in | Wt 141.9 lb

## 2023-05-10 DIAGNOSIS — M25562 Pain in left knee: Secondary | ICD-10-CM | POA: Diagnosis not present

## 2023-05-10 DIAGNOSIS — M25561 Pain in right knee: Secondary | ICD-10-CM | POA: Diagnosis not present

## 2023-05-10 NOTE — Progress Notes (Signed)
Patient ID: [name], female   DOB: 1951-05-28, 72 y.o.   MRN: [id number]  Reason for Consult: New Patient (Initial Visit)   Referred by Donetta Potts, MD  Subjective:     HPI:  [name] is a 72 y.o. female without significant vascular history.  She has been evaluated by Dr. Arbie Cookey a year and a half ago for lower extremity discomfort.  At the time she was not found to have any arterial disease.  She denies any claudication or nocturnal cramping.  She does have a history of an injury to her right ankle which healed well.  She has minimal varicosities and hemosiderin deposits in her ankles bilaterally but no wounds.  Recently had noninvasive testing which demonstrated concern for arterial insufficiency.  Past Medical History:  Diagnosis Date   Angio-edema    Anxiety    Cancer (HCC)    breast cancer   COPD (chronic obstructive pulmonary disease) (HCC)    Diabetes mellitus    Emphysema/COPD (HCC)    History of shingles 06/2011   Hypercholesterolemia    Hyperlipidemia    Neuromuscular disorder (HCC)    Shortness of breath    Family History  Problem Relation Age of Onset   Bowel Disease Mother    Hypertension Mother    Psoriasis Mother    Heart attack Father    Hypertension Father    Heart disease Brother    Aneurysm Brother    Past Surgical History:  Procedure Laterality Date   ABDOMINAL HYSTERECTOMY  1990   BREAST SURGERY  1998 and 2000   CARPAL TUNNEL RELEASE Left 02/13/2018   Procedure: LEFT CARPAL TUNNEL RELEASE;  Surgeon: Cindee Salt, MD;  Location: [facility];  Service: Orthopedics;  Laterality: Left;   CATARACT EXTRACTION W/PHACO Right 09/10/2013   Procedure: CATARACT EXTRACTION PHACO AND INTRAOCULAR LENS PLACEMENT (IOC);  Surgeon: Loraine Leriche T. Nile Riggs, MD;  [facility]: [facility];  Service: Ophthalmology;  Laterality: Right;  CDE:12.81   CHOLECYSTECTOMY  2000   CYST REMOVAL HAND Right    right wrist   ESOPHAGEAL DILATION N/A 12/15/2022    Procedure: ESOPHAGEAL DILATION;  Surgeon: Dolores Frame, MD;  [facility];  Service: Gastroenterology;  Laterality: N/A;  10:00am;ASA 3   ESOPHAGOGASTRODUODENOSCOPY (EGD) WITH PROPOFOL N/A 12/15/2022   Procedure: ESOPHAGOGASTRODUODENOSCOPY (EGD) WITH PROPOFOL;  Surgeon: Dolores Frame, MD;  [facility];  Service: Gastroenterology;  Laterality: N/A;  10:00am;ASA 3   FASCIECTOMY Left 02/13/2018   Procedure: FASCIECTOMY LEFT RING;  Surgeon: Cindee Salt, MD;  Location: [facility];  Service: Orthopedics;  Laterality: Left;  REG/AXILLARY BLOCK   FRACTURE SURGERY  1994   HERNIA REPAIR  2007   MASS EXCISION  08/11/2011   Procedure: EXCISION MASS;  Surgeon: Wyn Forster., MD;  Location: [facility];  Service: Orthopedics;  Laterality: Right;  Synovectomy of extnsor tendon right long finger   Rotator cuff Right    TUBAL LIGATION  1974   ULNAR NERVE TRANSPOSITION Left 02/13/2018   Procedure: DECOMPRESSION LEFT ULNA NERVE;  Surgeon: Cindee Salt, MD;  Location:  SURGERY CENTER;  Service: Orthopedics;  Laterality: Left;    Short Social History:  Social History   Tobacco Use   Smoking status: Every Day    Current packs/day: 1.00    Average packs/day: 1 pack/day for 50.0 years (50.0 ttl pk-yrs)    Types: Cigarettes   Smokeless tobacco: Never   Tobacco comments:  trying to quit  Substance Use Topics   Alcohol use: Yes    Comment: occasionally    Allergies  Allergen Reactions   Vicodin [Hydrocodone-Acetaminophen] Itching    Current Outpatient Medications  Medication Sig Dispense Refill   albuterol (PROVENTIL) (2.5 MG/3ML) 0.083% nebulizer solution Take 2.5 mg by nebulization every 4 (four) hours as needed.     albuterol (VENTOLIN HFA) 108 (90 Base) MCG/ACT inhaler Inhale 2 puffs into the lungs every 6 (six) hours as needed for wheezing or shortness of breath.     Calcium Carb-Cholecalciferol (CALCIUM  600+D) 600-20 MG-MCG TABS Take 1 tablet by mouth 2 (two) times daily.     celecoxib (CELEBREX) 200 MG capsule Take 200 mg by mouth daily.     cetirizine (ZYRTEC) 10 MG tablet Take 10 mg by mouth daily.     Cholecalciferol (VITAMIN D3) 250 MCG (10000 UT) capsule Take 10,000 Units by mouth daily.     clobetasol (TEMOVATE) 0.05 % external solution Apply 1 Application topically 2 (two) times daily.     Cyanocobalamin (B-12) 2500 MCG TABS Take 2,500 mcg by mouth daily.     famotidine (PEPCID) 20 MG tablet TAKE ONE TABLET BY MOUTH EVERYDAY AT BEDTIME AS NEEDED FOR heartburn OR INDIGESTION 60 tablet 3   fexofenadine (ALLEGRA) 180 MG tablet Take 180 mg by mouth daily as needed (hives).     fluticasone (FLONASE) 50 MCG/ACT nasal spray Place 1 spray into both nostrils daily.     montelukast (SINGULAIR) 10 MG tablet Take 1 tablet (10 mg total) by mouth at bedtime. 30 tablet 5   Omega-3 Fatty Acids (FISH OIL) 1200 MG CAPS Take 1,200 mg by mouth daily.     omeprazole (PRILOSEC) 20 MG capsule Take 20 mg by mouth daily.     pravastatin (PRAVACHOL) 40 MG tablet Take 40 mg by mouth at bedtime.     Semaglutide, 1 MG/DOSE, (OZEMPIC, 1 MG/DOSE,) 2 MG/1.5ML SOPN Inject 1 mg into the skin every Monday.     umeclidinium-vilanterol (ANORO ELLIPTA) 62.5-25 MCG/ACT AEPB Inhale 1 puff into the lungs daily.     No current facility-administered medications for this visit.    Review of Systems  Constitutional:  Constitutional negative. HENT: HENT negative.  Eyes: Eyes negative.  Respiratory: Respiratory negative.  Cardiovascular: Positive for leg swelling.  GI: Gastrointestinal negative.  Musculoskeletal: Musculoskeletal negative.  Skin: Skin negative.  Neurological: Neurological negative. Hematologic: Hematologic/lymphatic negative.  Psychiatric: Psychiatric negative.        Objective:  Objective   Vitals:   05/10/23 1538  BP: 139/61  Pulse: 74  Resp: 20  Temp: 97.7 F (36.5 C)  SpO2: 95%  Weight:  141 lb 14.4 oz (64.4 kg)  Height: 5\' 3"  (1.6 m)   Body mass index is 25.14 kg/m.  Physical Exam HENT:     Head: Normocephalic.     Nose: Nose normal.     Mouth/Throat:     Mouth: Mucous membranes are moist.  Eyes:     Pupils: Pupils are equal, round, and reactive to light.  Cardiovascular:     Rate and Rhythm: Normal rate.     Pulses: Normal pulses.  Pulmonary:     Effort: Pulmonary effort is normal.  Abdominal:     General: Abdomen is flat.     Palpations: Abdomen is soft.  Musculoskeletal:     Cervical back: Normal range of motion and neck supple.     Right lower leg: No edema.     Left  lower leg: No edema.  Skin:    General: Skin is warm.     Capillary Refill: Capillary refill takes less than 2 seconds.  Neurological:     General: No focal deficit present.     Mental Status: She is alert.  Psychiatric:        Mood and Affect: Mood normal.        Thought Content: Thought content normal.     Data: ABI Findings:  +---------+------------------+-----+---------+--------+  Right   Rt Pressure (mmHg)IndexWaveform Comment   +---------+------------------+-----+---------+--------+  Brachial 144                                       +---------+------------------+-----+---------+--------+  PTA     138               0.96 triphasic          +---------+------------------+-----+---------+--------+  DP      117               0.81 triphasic          +---------+------------------+-----+---------+--------+  Great Toe101               0.70                    +---------+------------------+-----+---------+--------+   +---------+------------------+-----+---------+-------+  Left    Lt Pressure (mmHg)IndexWaveform Comment  +---------+------------------+-----+---------+-------+  Brachial 144                                      +---------+------------------+-----+---------+-------+  PTA     122               0.85 triphasic          +---------+------------------+-----+---------+-------+  DP      125               0.87 triphasic         +---------+------------------+-----+---------+-------+  Great Toe111               0.77                   +---------+------------------+-----+---------+-------+   +-------+-----------+-----------+------------+------------+  ABI/TBIToday's ABIToday's TBIPrevious ABIPrevious TBI  +-------+-----------+-----------+------------+------------+  Right 0.96       0.7        0.82        -             +-------+-----------+-----------+------------+------------+  Left  0.87       0.77       0.9         -             +-------+-----------+-----------+------------+------------+        Previous ABI in June 2022 at outside facility.    Summary:  Right: Resting right ankle-brachial index is within normal range. No  evidence of significant right lower extremity arterial disease. The right  toe-brachial index is normal.   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 The left toe-brachial index is normal.        Assessment/Plan:    72 year old female sent for evaluation of outside ABIs which were abnormal.  She denies any new studies today but her previous ABIs were normal a year and a half ago and she has palpable pedal  pulses bilaterally.  Remains asymptomatic.  As such she can see me on an as-needed basis.     Maeola Harman MD [facility]

## 2023-05-11 DIAGNOSIS — D696 Thrombocytopenia, unspecified: Secondary | ICD-10-CM | POA: Diagnosis not present

## 2023-05-15 ENCOUNTER — Encounter (INDEPENDENT_AMBULATORY_CARE_PROVIDER_SITE_OTHER): Payer: Self-pay | Admitting: Gastroenterology

## 2023-05-15 ENCOUNTER — Ambulatory Visit (INDEPENDENT_AMBULATORY_CARE_PROVIDER_SITE_OTHER): Payer: Medicare HMO | Admitting: Gastroenterology

## 2023-05-15 DIAGNOSIS — K219 Gastro-esophageal reflux disease without esophagitis: Secondary | ICD-10-CM | POA: Diagnosis not present

## 2023-05-15 NOTE — Patient Instructions (Signed)
-  continue Omeprazole 20mg  daily  -continue famotidine 20mg  at bedtime   Please make me aware of any new or worsening GI symptoms  Follow up 1 year  It was a pleasure to see you today. I want to create trusting relationships with patients and provide genuine, compassionate, and quality care. I truly value your feedback! please be on the lookout for a survey regarding your visit with me today. I appreciate your input about our visit and your time in completing this!    Torri Langston L. Jeanmarie Hubert, MSN, APRN, AGNP-C Adult-Gerontology Nurse Practitioner [facility]

## 2023-05-15 NOTE — Progress Notes (Signed)
Primary Care Physician:  Donetta Potts, MD  Primary GI: Dr. Levon Hedger   Patient Location: Home   Provider Location: [facility]   Reason for Visit: follow up of dysphagia.    Persons present on the virtual encounter, with roles: Kaylee Mcshea L. Jeanmarie Hubert, MSN, APRN, AGNP-C, [name], Patient    Total time (minutes) spent on medical discussion: 5 minutes  Virtual Visit via Telephone visit is conducted virtually and was requested by patient.   I connected with [name] on 05/15/23 at  9:45 AM EDT by telephone and verified that I am speaking with the correct person using two identifiers.   I discussed the limitations, risks, security and privacy concerns of performing an evaluation and management service by telephone and the availability of in person appointments. I also discussed with the patient that there may be a patient responsible charge related to this service. The patient expressed understanding and agreed to proceed.  Chief Complaint  Patient presents with   Dysphagia    Patient doing a phone visit today. Follow up on dysphagia. States doing well.    History of Present Illness: [name] is a 72 y.o. female with past medical history of anxiety, breast cancer, COPD, DM, neuromuscular disorder  Last seen April 2024, at that time havign dysphagia with pills, foods. Having regurgitation often. Some weight loss on ozempic. Taking famotidine 20mg  at bedtime with resolution of GERD symptoms.   Recommended to continue with famotidine, schedule EGD  EGD as outlined below, referred to ENT for tongue nodularity, recommended Ph Impedence testing and manometry if recurrent regurgitation.   Present: Patient doing well, dysphagia has resolved since EGD. She is doing omeprazole 20mg  daily and famotidine 20mg  at bedtime. Doing well on these no heartburn or acid regurgitation. She has no GI complaints today.   Saw ENT regarding tongue abnormality noted on EGD, ENT  did not see anything they were concerned about, discussed more invasive testing with the patient but ENT did not feel it was warranted so patient did not undergo any further testing.   No red flag symptoms. Patient denies melena, hematochezia, nausea, vomiting, diarrhea, constipation, dysphagia, odyonophagia, early satiety or weight loss.   Last EGD: 11/2022  - The base of the tongue had a purplish nodular                            discoloration                           - No endoscopic esophageal abnormality to explain                            patient's dysphagia. Esophagus dilated. Dilated.                           - 2 cm hiatal hernia.                           - Normal stomach.                           - Normal examined duodenum.                           -  No specimens collected. Last Colonoscopy:09/24/20 - Hemorrhoids found on perianal exam.  - Diverticulosis in the sigmoid colon.  - Tortuous colon.  - Non-bleeding internal hemorrhoids.   - No specimens collected.   Past Medical History:  Diagnosis Date   Angio-edema    Anxiety    Cancer [facility])    breast cancer   COPD (chronic obstructive pulmonary disease) (HCC)    Diabetes mellitus    Emphysema/COPD (HCC)    History of shingles 06/2011   Hypercholesterolemia    Hyperlipidemia    Neuromuscular disorder (HCC)    Shortness of breath      Past Surgical History:  Procedure Laterality Date   ABDOMINAL HYSTERECTOMY  1990   BREAST SURGERY  1998 and 2000   CARPAL TUNNEL RELEASE Left 02/13/2018   Procedure: LEFT CARPAL TUNNEL RELEASE;  Surgeon: Cindee Salt, MD;  Location: [facility];  Service: Orthopedics;  Laterality: Left;   CATARACT EXTRACTION W/PHACO Right 09/10/2013   Procedure: CATARACT EXTRACTION PHACO AND INTRAOCULAR LENS PLACEMENT (IOC);  Surgeon: Loraine Leriche T. Nile Riggs, MD;  [facility]: [facility];  Service: Ophthalmology;  Laterality: Right;  CDE:12.81   CHOLECYSTECTOMY  2000   CYST REMOVAL HAND Right    right  wrist   ESOPHAGEAL DILATION N/A 12/15/2022   Procedure: ESOPHAGEAL DILATION;  Surgeon: Dolores Frame, MD;  [facility];  Service: Gastroenterology;  Laterality: N/A;  10:00am;ASA 3   ESOPHAGOGASTRODUODENOSCOPY (EGD) WITH PROPOFOL N/A 12/15/2022   Procedure: ESOPHAGOGASTRODUODENOSCOPY (EGD) WITH PROPOFOL;  Surgeon: Dolores Frame, MD;  [facility];  Service: Gastroenterology;  Laterality: N/A;  10:00am;ASA 3   FASCIECTOMY Left 02/13/2018   Procedure: FASCIECTOMY LEFT RING;  Surgeon: Cindee Salt, MD;  Location: [facility];  Service: Orthopedics;  Laterality: Left;  REG/AXILLARY BLOCK   FRACTURE SURGERY  1994   HERNIA REPAIR  2007   MASS EXCISION  08/11/2011   Procedure: EXCISION MASS;  Surgeon: Wyn Forster., MD;  Location: [facility];  Service: Orthopedics;  Laterality: Right;  Synovectomy of extnsor tendon right long finger   Rotator cuff Right    TUBAL LIGATION  1974   ULNAR NERVE TRANSPOSITION Left 02/13/2018   Procedure: DECOMPRESSION LEFT ULNA NERVE;  Surgeon: Cindee Salt, MD;  Location: [facility];  Service: Orthopedics;  Laterality: Left;     Current Meds  Medication Sig   albuterol (PROVENTIL) (2.5 MG/3ML) 0.083% nebulizer solution Take 2.5 mg by nebulization every 4 (four) hours as needed.   albuterol (VENTOLIN HFA) 108 (90 Base) MCG/ACT inhaler Inhale 2 puffs into the lungs every 6 (six) hours as needed for wheezing or shortness of breath.   Calcium Carb-Cholecalciferol (CALCIUM 600+D) 600-20 MG-MCG TABS Take 1 tablet by mouth 2 (two) times daily.   celecoxib (CELEBREX) 200 MG capsule Take 200 mg by mouth daily.   cetirizine (ZYRTEC) 10 MG tablet Take 10 mg by mouth daily.   Cholecalciferol (VITAMIN D3) 250 MCG (10000 UT) capsule Take 10,000 Units by mouth daily.   Cyanocobalamin (B-12) 2500 MCG TABS Take 2,500 mcg by mouth daily.   famotidine (PEPCID) 20 MG tablet TAKE ONE TABLET BY MOUTH  EVERYDAY AT BEDTIME AS NEEDED FOR heartburn OR INDIGESTION   fexofenadine (ALLEGRA) 180 MG tablet Take 180 mg by mouth daily as needed (hives).   fluticasone (FLONASE) 50 MCG/ACT nasal spray Place 1 spray into both nostrils daily.   montelukast (SINGULAIR) 10 MG tablet Take 1 tablet (10 mg total) by mouth  at bedtime.   Omega-3 Fatty Acids (FISH OIL) 1200 MG CAPS Take 1,200 mg by mouth daily.   omeprazole (PRILOSEC) 20 MG capsule Take 20 mg by mouth daily.   pravastatin (PRAVACHOL) 40 MG tablet Take 40 mg by mouth at bedtime.   Semaglutide, 1 MG/DOSE, (OZEMPIC, 1 MG/DOSE,) 2 MG/1.5ML SOPN Inject 1 mg into the skin every Monday.   umeclidinium-vilanterol (ANORO ELLIPTA) 62.5-25 MCG/ACT AEPB Inhale 1 puff into the lungs daily.     Family History  Problem Relation Age of Onset   Bowel Disease Mother    Hypertension Mother    Psoriasis Mother    Heart attack Father    Hypertension Father    Heart disease Brother    Aneurysm Brother     Social History   Socioeconomic History   Marital status: Widowed    Spouse name: Not on file   Number of children: Not on file   Years of education: Not on file   Highest education level: Not on file  Occupational History   Not on file  Tobacco Use   Smoking status: Every Day    Current packs/day: 1.00    Average packs/day: 1 pack/day for 50.0 years (50.0 ttl pk-yrs)    Types: Cigarettes   Smokeless tobacco: Never   Tobacco comments:     trying to quit  Vaping Use   Vaping status: Former  Substance and Sexual Activity   Alcohol use: Yes    Comment: occasionally   Drug use: No   Sexual activity: Yes    Birth control/protection: Post-menopausal  Other Topics Concern   Not on file  Social History Narrative   Not on file   Social Determinants of Health   Financial Resource Strain: Low Risk  (10/19/2021)   Received from [facility], [facility]   Overall Financial Resource Strain (CARDIA)    Difficulty of Paying Living Expenses:  Not hard at all  Food Insecurity: Low Risk  (03/15/2023)   Received from [facility]   Hunger Vital Sign    Worried About Running Out of Food in the Last Year: Never true    Ran Out of Food in the Last Year: Never true  Transportation Needs: No Transportation Needs (10/19/2021)   Received from [facility], [facility]   PRAPARE - Transportation    Lack of Transportation (Medical): No    Lack of Transportation (Non-Medical): No  Physical Activity: Inactive (10/19/2021)   Received from [facility], [facility]   Exercise Vital Sign    Days of Exercise per Week: 0 days    Minutes of Exercise per Session: 0 min  Stress: No Stress Concern Present (10/19/2021)   Received from [facility], [facility] of Occupational Health - Occupational Stress Questionnaire    Feeling of Stress : Not at all  Social Connections: Moderately Isolated (10/19/2021)   Received from [facility], [facility]   Social Connection and Isolation Panel [NHANES]    Frequency of Communication with Friends and Family: More than three times a week    Frequency of Social Gatherings with Friends and Family: Twice a week    Attends Religious Services: More than 4 times per year    Active Member of [employer] or Organizations: No    Attends Banker Meetings: Never    Marital Status: Widowed    Review of Systems: Gen: Denies fever, chills, anorexia. Denies fatigue,  weakness, weight loss.  CV: Denies chest pain, palpitations, syncope, peripheral edema, and claudication. Resp: Denies dyspnea at rest, cough, wheezing, coughing up blood, and pleurisy. GI: see HPI Derm: Denies rash, itching, dry skin Psych: Denies depression, anxiety, memory loss, confusion. No homicidal or suicidal ideation.  Heme: Denies bruising, bleeding, and enlarged lymph nodes.  Observations/Objective: No distress. Unable to perform physical exam due to telephone encounter. No video available.    Assessment and Plan: [name] is a 72 year old female presenting today for follow up of GERD/dysphagia.  Dysphagia resolved since EGD with dilation. GERD is well controlled on omeprazole 20mg  daily and famotidine 20mg  at bedtime. She has no GI complaints today. We will continue with low dose PPI and H2B daily. She should let me know if she has any new or worsening GI symptoms   -continue Omeprazole 20mg  daily  -continue famotidine 20mg  at bedtime   Follow Up Instructions: 1 year    I discussed the assessment and treatment plan with the patient. The patient was provided an opportunity to ask questions and all were answered. The patient agreed with the plan and demonstrated an understanding of the instructions.   The patient was advised to call back or seek an in-person evaluation if the symptoms worsen or if the condition fails to improve as anticipated.  I provided 5 minutes of NON face-to-face time during this telephone encounter.  Tijuan Dantes L. Jeanmarie Hubert, MSN, APRN, AGNP-C Adult-Gerontology Nurse Practitioner [facility]  I have reviewed the note and agree with the APP's assessment as described in this progress note  Katrinka Blazing, MD Gastroenterology and Hepatology [facility]

## 2023-05-16 DIAGNOSIS — B353 Tinea pedis: Secondary | ICD-10-CM | POA: Diagnosis not present

## 2023-06-19 DIAGNOSIS — B353 Tinea pedis: Secondary | ICD-10-CM | POA: Diagnosis not present

## 2023-06-21 DIAGNOSIS — N390 Urinary tract infection, site not specified: Secondary | ICD-10-CM | POA: Diagnosis not present

## 2023-06-21 DIAGNOSIS — R030 Elevated blood-pressure reading, without diagnosis of hypertension: Secondary | ICD-10-CM | POA: Diagnosis not present

## 2023-06-21 DIAGNOSIS — F17210 Nicotine dependence, cigarettes, uncomplicated: Secondary | ICD-10-CM | POA: Diagnosis not present

## 2023-06-21 DIAGNOSIS — Z6826 Body mass index (BMI) 26.0-26.9, adult: Secondary | ICD-10-CM | POA: Diagnosis not present

## 2023-08-01 ENCOUNTER — Other Ambulatory Visit (INDEPENDENT_AMBULATORY_CARE_PROVIDER_SITE_OTHER): Payer: Self-pay | Admitting: Gastroenterology

## 2023-08-21 DIAGNOSIS — D485 Neoplasm of uncertain behavior of skin: Secondary | ICD-10-CM | POA: Diagnosis not present

## 2023-08-21 DIAGNOSIS — B353 Tinea pedis: Secondary | ICD-10-CM | POA: Diagnosis not present

## 2023-08-31 DIAGNOSIS — D492 Neoplasm of unspecified behavior of bone, soft tissue, and skin: Secondary | ICD-10-CM | POA: Diagnosis not present

## 2023-08-31 DIAGNOSIS — D485 Neoplasm of uncertain behavior of skin: Secondary | ICD-10-CM | POA: Diagnosis not present

## 2023-09-25 DIAGNOSIS — E1140 Type 2 diabetes mellitus with diabetic neuropathy, unspecified: Secondary | ICD-10-CM | POA: Diagnosis not present

## 2023-09-25 DIAGNOSIS — E1165 Type 2 diabetes mellitus with hyperglycemia: Secondary | ICD-10-CM | POA: Diagnosis not present

## 2023-09-27 DIAGNOSIS — H524 Presbyopia: Secondary | ICD-10-CM | POA: Diagnosis not present

## 2023-09-27 DIAGNOSIS — Z961 Presence of intraocular lens: Secondary | ICD-10-CM | POA: Diagnosis not present

## 2023-09-27 DIAGNOSIS — H43813 Vitreous degeneration, bilateral: Secondary | ICD-10-CM | POA: Diagnosis not present

## 2023-09-27 DIAGNOSIS — E119 Type 2 diabetes mellitus without complications: Secondary | ICD-10-CM | POA: Diagnosis not present

## 2023-09-27 DIAGNOSIS — H52223 Regular astigmatism, bilateral: Secondary | ICD-10-CM | POA: Diagnosis not present

## 2023-09-27 DIAGNOSIS — H26493 Other secondary cataract, bilateral: Secondary | ICD-10-CM | POA: Diagnosis not present

## 2023-10-02 DIAGNOSIS — M810 Age-related osteoporosis without current pathological fracture: Secondary | ICD-10-CM | POA: Diagnosis not present

## 2023-10-02 DIAGNOSIS — F17200 Nicotine dependence, unspecified, uncomplicated: Secondary | ICD-10-CM | POA: Diagnosis not present

## 2023-10-02 DIAGNOSIS — E7800 Pure hypercholesterolemia, unspecified: Secondary | ICD-10-CM | POA: Diagnosis not present

## 2023-10-02 DIAGNOSIS — Z6827 Body mass index (BMI) 27.0-27.9, adult: Secondary | ICD-10-CM | POA: Diagnosis not present

## 2023-10-02 DIAGNOSIS — E1169 Type 2 diabetes mellitus with other specified complication: Secondary | ICD-10-CM | POA: Diagnosis not present

## 2023-11-30 DIAGNOSIS — M810 Age-related osteoporosis without current pathological fracture: Secondary | ICD-10-CM | POA: Diagnosis not present

## 2024-01-01 ENCOUNTER — Other Ambulatory Visit (HOSPITAL_COMMUNITY): Payer: Self-pay | Admitting: Internal Medicine

## 2024-01-01 DIAGNOSIS — Z87891 Personal history of nicotine dependence: Secondary | ICD-10-CM

## 2024-01-14 ENCOUNTER — Ambulatory Visit (HOSPITAL_COMMUNITY)
Admission: RE | Admit: 2024-01-14 | Discharge: 2024-01-14 | Disposition: A | Discharge status: Home / Self Care | Source: Ambulatory Visit | Attending: Internal Medicine | Admitting: Internal Medicine

## 2024-01-14 DIAGNOSIS — Z87891 Personal history of nicotine dependence: Secondary | ICD-10-CM | POA: Insufficient documentation

## 2024-01-14 DIAGNOSIS — F17210 Nicotine dependence, cigarettes, uncomplicated: Secondary | ICD-10-CM | POA: Diagnosis not present

## 2024-04-03 ENCOUNTER — Ambulatory Visit: Attending: Internal Medicine | Admitting: Internal Medicine

## 2024-04-03 ENCOUNTER — Encounter: Payer: Self-pay | Admitting: Internal Medicine

## 2024-04-03 VITALS — BP 122/64 | HR 81 | Ht 63.0 in | Wt 152.2 lb

## 2024-04-03 DIAGNOSIS — I2510 Atherosclerotic heart disease of native coronary artery without angina pectoris: Secondary | ICD-10-CM

## 2024-04-03 DIAGNOSIS — I739 Peripheral vascular disease, unspecified: Secondary | ICD-10-CM | POA: Diagnosis not present

## 2024-04-03 DIAGNOSIS — I2584 Coronary atherosclerosis due to calcified coronary lesion: Secondary | ICD-10-CM

## 2024-04-03 DIAGNOSIS — I359 Nonrheumatic aortic valve disorder, unspecified: Secondary | ICD-10-CM | POA: Diagnosis not present

## 2024-04-03 MED ORDER — ASPIRIN 81 MG PO TBEC: 81.0000 mg | DELAYED_RELEASE_TABLET | Freq: Every day | ORAL | 2 refills | Status: AC

## 2024-04-03 NOTE — Progress Notes (Signed)
 Cardiology Office Note  Date: 04/03/2024   ID: RENAD JENNIGES, DOB 06-10-1951, MRN [id number]  PCP:  Trudy Vaughn FALCON, MD  Cardiologist:  Diannah SHAUNNA Maywood, MD Electrophysiologist:  None   Reason for Office Visit: Coronary calcification and aortic valve calcification   History of Present Illness: [name] is a 73 y.o. female known to have DM 2, COPD, HLD was referred to cardiology clinic for evaluation of coronary artery calcification and aortic valve calcification.  CT chest lung cancer screening from 6/24 showed calcified plaque in the left main, LAD and RCA as well as calcifications of the aortic valve and mitral annulus.  Repeat CT chest lung cancer screening in June 2025 showed coronary artery calcifications, emphysema, aortic valve calcifications and to consider echocardiographic to evaluate for any valve dysfunction. Bilateral ABIs performed in 2023 that were within normal limits, R ABI 0.96 and L ABI 0.87 with triphasic waveforms.  She is here for follow-up visit.  No angina or DOE.  No dizziness, syncope, palpitations, leg swelling.  Past Medical History:  Diagnosis Date   Angio-edema    Anxiety    Cancer [facility])    breast cancer   COPD (chronic obstructive pulmonary disease) (HCC)    Diabetes mellitus    Emphysema/COPD (HCC)    History of shingles 06/2011   Hypercholesterolemia    Hyperlipidemia    Neuromuscular disorder (HCC)    Shortness of breath     Past Surgical History:  Procedure Laterality Date   ABDOMINAL HYSTERECTOMY  1990   BREAST SURGERY  1998 and 2000   CARPAL TUNNEL RELEASE Left 02/13/2018   Procedure: LEFT CARPAL TUNNEL RELEASE;  Surgeon: Murrell Kuba, MD;  Location: [facility];  Service: Orthopedics;  Laterality: Left;   CATARACT EXTRACTION W/PHACO Right 09/10/2013   Procedure: CATARACT EXTRACTION PHACO AND INTRAOCULAR LENS PLACEMENT (IOC);  Surgeon: Oneil T. Roz, MD;  Location: [facility];  Service: Ophthalmology;   Laterality: Right;  CDE:12.81   CHOLECYSTECTOMY  2000   CYST REMOVAL HAND Right    right wrist   ESOPHAGEAL DILATION N/A 12/15/2022   Procedure: ESOPHAGEAL DILATION;  Surgeon: Eartha Angelia Sieving, MD;  Location: [facility];  Service: Gastroenterology;  Laterality: N/A;  10:00am;ASA 3   ESOPHAGOGASTRODUODENOSCOPY (EGD) WITH PROPOFOL  N/A 12/15/2022   Procedure: ESOPHAGOGASTRODUODENOSCOPY (EGD) WITH PROPOFOL ;  Surgeon: Eartha Angelia Sieving, MD;  Location: [facility];  Service: Gastroenterology;  Laterality: N/A;  10:00am;ASA 3   FASCIECTOMY Left 02/13/2018   Procedure: FASCIECTOMY LEFT RING;  Surgeon: Murrell Kuba, MD;  Location: [facility];  Service: Orthopedics;  Laterality: Left;  REG/AXILLARY BLOCK   FRACTURE SURGERY  1994   HERNIA REPAIR  2007   MASS EXCISION  08/11/2011   Procedure: EXCISION MASS;  Surgeon: Lamar LULLA Leonor Mickey., MD;  Location: [facility];  Service: Orthopedics;  Laterality: Right;  Synovectomy of extnsor tendon right long finger   Rotator cuff Right    TUBAL LIGATION  1974   ULNAR NERVE TRANSPOSITION Left 02/13/2018   Procedure: DECOMPRESSION LEFT ULNA NERVE;  Surgeon: Murrell Kuba, MD;  Location: [facility];  Service: Orthopedics;  Laterality: Left;    Current Outpatient Medications  Medication Sig Dispense Refill   albuterol (PROVENTIL) (2.5 MG/3ML) 0.083% nebulizer solution Take 2.5 mg by nebulization every 4 (four) hours as needed.     albuterol (VENTOLIN HFA) 108 (90 Base) MCG/ACT inhaler Inhale 2 puffs into the lungs every 6 (six) hours as  needed for wheezing or shortness of breath.     aspirin  EC 81 MG tablet Take 1 tablet (81 mg total) by mouth daily. Swallow whole. 90 tablet 2   Calcium Carb-Cholecalciferol (CALCIUM 600+D) 600-20 MG-MCG TABS Take 1 tablet by mouth 2 (two) times daily.     celecoxib (CELEBREX) 200 MG capsule Take 200 mg by mouth daily.     cetirizine  (ZYRTEC ) 10 MG tablet Take 10 mg by mouth  daily.     Cholecalciferol (VITAMIN D3) 250 MCG (10000 UT) capsule Take 10,000 Units by mouth daily.     Cyanocobalamin  (B-12) 2500 MCG TABS Take 2,500 mcg by mouth daily.     famotidine  (PEPCID ) 20 MG tablet TAKE 1 TABLET BY MOUTH EVERY DAY AT BEDTIME FOR HEARTBURN OR INDIGESTION 30 tablet 10   fexofenadine  (ALLEGRA ) 180 MG tablet Take 180 mg by mouth daily as needed (hives).     fluticasone (FLONASE) 50 MCG/ACT nasal spray Place 1 spray into both nostrils daily.     montelukast  (SINGULAIR ) 10 MG tablet Take 1 tablet (10 mg total) by mouth at bedtime. 30 tablet 5   Omega-3 Fatty Acids (FISH OIL) 1200 MG CAPS Take 1,200 mg by mouth daily.     omeprazole (PRILOSEC) 20 MG capsule Take 20 mg by mouth daily.     pravastatin (PRAVACHOL) 40 MG tablet Take 40 mg by mouth at bedtime.     Semaglutide, 1 MG/DOSE, (OZEMPIC, 1 MG/DOSE,) 2 MG/1.5ML SOPN Inject 1 mg into the skin every Monday.     umeclidinium-vilanterol (ANORO ELLIPTA) 62.5-25 MCG/ACT AEPB Inhale 1 puff into the lungs daily.     No current facility-administered medications for this visit.   Allergies:  Vicodin [hydrocodone-acetaminophen ]   Social History: The patient  reports that she has been smoking cigarettes. She has a 50 pack-year smoking history. She has never used smokeless tobacco. She reports current alcohol  use. She reports that she does not use drugs.   Family History: The patient's family history includes Aneurysm in her brother; Bowel Disease in her mother; Heart attack in her father; Heart disease in her brother; Hypertension in her father and mother; Psoriasis in her mother.   ROS:  Please see the history of present illness. Otherwise, complete review of systems is positive for none  All other systems are reviewed and negative.   Physical Exam: VS:  BP 122/64   Pulse 81   Ht 5' 3 (1.6 m)   Wt 152 lb 3.2 oz (69 kg)   SpO2 98%   BMI 26.96 kg/m , BMI Body mass index is 26.96 kg/m.  Wt Readings from Last 3 Encounters:   04/03/24 152 lb 3.2 oz (69 kg)  05/10/23 141 lb 14.4 oz (64.4 kg)  02/08/23 139 lb 9.6 oz (63.3 kg)    General: Patient appears comfortable at rest. HEENT: Conjunctiva and lids normal, oropharynx clear with moist mucosa. Neck: Supple, no elevated JVP or carotid bruits, no thyromegaly. Lungs: Clear to auscultation, nonlabored breathing at rest. Cardiac: Regular rate and rhythm, no S3 or significant systolic murmur, no pericardial rub. Abdomen: Soft, nontender, no hepatomegaly, bowel sounds present, no guarding or rebound. Extremities: No pitting edema, distal pulses 2+. Skin: Warm and dry. Musculoskeletal: No kyphosis. Neuropsychiatric: Alert and oriented x3, affect grossly appropriate.  Recent Labwork: No results found for requested labs within last 365 days.     Component Value Date/Time   CHOL 136 05/24/2022 0922   TRIG 172 (H) 05/24/2022 0922    Assessment and  Plan:  Imaging evidence of coronary artery calcifications and aortic valve calcifications - Asymptomatic. No angina or DOE. - Discussed symptoms of CAD and MI. - Heart healthy diet and exercise recommendations provided. - No loud murmur on exam.  She has imaging evidence of aortic valve calcifications and echocardiogram was recommended, will obtain echocardiogram.  # Mild PAD: Mild LLE PAD in 2023.  No claudication.  Repeat ABIs.  Start aspirin  81 mg once daily, continue pravastatin 40 mg nightly.  # Type 2 diabetes mellitus, on Ozempic, follows with PCP.  # HLD: Continue pravastatin 40 mg nightly.  Goal LDL less than 70.    I have spent a total of 30 minutes with patient reviewing chart, EKGs, labs and examining patient as well as establishing an assessment and plan that was discussed with the patient.  > 50% of time was spent in direct patient care.    Medication Adjustments/Labs and Tests Ordered: Current medicines are reviewed at length with the patient today.  Concerns regarding medicines are outlined above.    Tests Ordered: Orders Placed This Encounter  Procedures   EKG 12-Lead   ECHOCARDIOGRAM COMPLETE   VAS US  ABI WITH/WO TBI    Medication Changes: Meds ordered this encounter  Medications   aspirin  EC 81 MG tablet    Sig: Take 1 tablet (81 mg total) by mouth daily. Swallow whole.    Dispense:  90 tablet    Refill:  2    04/03/2024-New    Disposition:  Follow up 1 year  Signed Amyia Lodwick Arleta Maywood, MD, 04/03/2024 4:34 PM    [facility] [address] [place], [place], [place] [address]

## 2024-04-03 NOTE — Patient Instructions (Addendum)
 Medication Instructions:  Your physician has recommended you make the following change in your medication:  Start taking Aspirin  81 mg once daily  Continue taking all other medications as prescribed  Labwork: None  Testing/Procedures: Your physician has requested that you have an echocardiogram. Echocardiography is a painless test that uses sound waves to create images of your heart. It provides your doctor with information about the size and shape of your heart and how well your heart's chambers and valves are working. This procedure takes approximately one hour. There are no restrictions for this procedure. Please do NOT wear cologne, perfume, aftershave, or lotions (deodorant is allowed). Please arrive 15 minutes prior to your appointment time.  Please note: We ask at that you not bring children with you during ultrasound (echo/ vascular) testing. Due to room size and safety concerns, children are not allowed in the ultrasound rooms during exams. Our front office staff cannot provide observation of children in our lobby area while testing is being conducted. An adult accompanying a patient to their appointment will only be allowed in the ultrasound room at the discretion of the ultrasound technician under special circumstances. We apologize for any inconvenience.  Your physician has requested that you have an ankle brachial index (ABI). During this test an ultrasound and blood pressure cuff are used to evaluate the arteries that supply the arms and legs with blood. Allow thirty minutes for this exam. There are no restrictions or special instructions.  Please note: We ask at that you not bring children with you during ultrasound (echo/ vascular) testing. Due to room size and safety concerns, children are not allowed in the ultrasound rooms during exams. Our front office staff cannot provide observation of children in our lobby area while testing is being conducted. An adult accompanying a patient  to their appointment will only be allowed in the ultrasound room at the discretion of the ultrasound technician under special circumstances. We apologize for any inconvenience.   Follow-Up: Your physician recommends that you schedule a follow-up appointment in: 1 year. You will receive a reminder call in about 8 months reminding you to schedule your appointment. If you don't receive this call, please contact our office.   Any Other Special Instructions Will Be Listed Below (If Applicable). Thank you for choosing [facility]!     If you need a refill on your cardiac medications before your next appointment, please call your pharmacy.

## 2024-04-23 ENCOUNTER — Ambulatory Visit: Attending: Internal Medicine

## 2024-04-23 ENCOUNTER — Ambulatory Visit (INDEPENDENT_AMBULATORY_CARE_PROVIDER_SITE_OTHER)

## 2024-04-23 DIAGNOSIS — I359 Nonrheumatic aortic valve disorder, unspecified: Secondary | ICD-10-CM | POA: Diagnosis not present

## 2024-04-23 DIAGNOSIS — I739 Peripheral vascular disease, unspecified: Secondary | ICD-10-CM | POA: Diagnosis not present

## 2024-04-23 LAB — VAS US ABI WITH/WO TBI
Left ABI: 0.81
Right ABI: 0.87

## 2024-04-24 LAB — ECHOCARDIOGRAM COMPLETE
AR max vel: 1.99 cm2
AV Area VTI: 2.16 cm2
AV Area mean vel: 1.93 cm2
AV Mean grad: 8 mmHg
AV Peak grad: 16.6 mmHg
Ao pk vel: 2.04 m/s
Area-P 1/2: 2.37 cm2
Calc EF: 64.9 %
MV VTI: 4.32 cm2
S' Lateral: 2.2 cm
Single Plane A2C EF: 58.4 %
Single Plane A4C EF: 69.5 %

## 2024-04-29 ENCOUNTER — Ambulatory Visit: Payer: Self-pay | Admitting: Internal Medicine

## 2024-04-29 DIAGNOSIS — Z1231 Encounter for screening mammogram for malignant neoplasm of breast: Secondary | ICD-10-CM | POA: Diagnosis not present

## 2024-05-14 ENCOUNTER — Ambulatory Visit (INDEPENDENT_AMBULATORY_CARE_PROVIDER_SITE_OTHER): Payer: Medicare HMO | Admitting: Gastroenterology

## 2024-05-15 ENCOUNTER — Encounter (INDEPENDENT_AMBULATORY_CARE_PROVIDER_SITE_OTHER): Payer: Self-pay | Admitting: Gastroenterology

## 2024-07-18 ENCOUNTER — Other Ambulatory Visit (INDEPENDENT_AMBULATORY_CARE_PROVIDER_SITE_OTHER): Payer: Self-pay | Admitting: Gastroenterology

## 2024-08-19 ENCOUNTER — Other Ambulatory Visit (INDEPENDENT_AMBULATORY_CARE_PROVIDER_SITE_OTHER): Payer: Self-pay | Admitting: Gastroenterology
